# Patient Record
Sex: Female | Born: 1955 | Race: White | Hispanic: No | Smoking: Current every day smoker
Health system: Southern US, Community
[De-identification: ages and names within clinical notes are randomized; demographics above are authoritative.]

## PROBLEM LIST (undated history)

## (undated) DIAGNOSIS — F32A Depression, unspecified: Secondary | ICD-10-CM

## (undated) DIAGNOSIS — F329 Major depressive disorder, single episode, unspecified: Secondary | ICD-10-CM

## (undated) DIAGNOSIS — J449 Chronic obstructive pulmonary disease, unspecified: Secondary | ICD-10-CM

## (undated) DIAGNOSIS — F319 Bipolar disorder, unspecified: Secondary | ICD-10-CM

## (undated) DIAGNOSIS — K219 Gastro-esophageal reflux disease without esophagitis: Secondary | ICD-10-CM

## (undated) HISTORY — DX: Gastro-esophageal reflux disease without esophagitis: K21.9

## (undated) HISTORY — DX: Bipolar disorder, unspecified: F31.9

## (undated) HISTORY — DX: Depression, unspecified: F32.A

## (undated) HISTORY — PX: CHOLECYSTECTOMY: SHX55

## (undated) HISTORY — DX: Chronic obstructive pulmonary disease, unspecified: J44.9

## (undated) HISTORY — PX: LUMBAR LAMINECTOMY: SHX95

## (undated) HISTORY — PX: APPENDECTOMY: SHX54

## (undated) HISTORY — DX: Major depressive disorder, single episode, unspecified: F32.9

---

## 1997-10-30 ENCOUNTER — Emergency Department (HOSPITAL_COMMUNITY): Admission: EM | Admit: 1997-10-30 | Discharge: 1997-10-30 | Payer: Self-pay | Admitting: Emergency Medicine

## 1998-03-07 ENCOUNTER — Emergency Department (HOSPITAL_COMMUNITY): Admission: EM | Admit: 1998-03-07 | Discharge: 1998-03-07 | Payer: Self-pay | Admitting: Emergency Medicine

## 1998-04-20 ENCOUNTER — Emergency Department (HOSPITAL_COMMUNITY): Admission: EM | Admit: 1998-04-20 | Discharge: 1998-04-20 | Payer: Self-pay | Admitting: Internal Medicine

## 1998-05-02 ENCOUNTER — Ambulatory Visit (HOSPITAL_COMMUNITY): Admission: RE | Admit: 1998-05-02 | Discharge: 1998-05-02 | Payer: Self-pay | Admitting: Psychiatry

## 1998-05-29 ENCOUNTER — Ambulatory Visit (HOSPITAL_BASED_OUTPATIENT_CLINIC_OR_DEPARTMENT_OTHER): Admission: RE | Admit: 1998-05-29 | Discharge: 1998-05-29 | Payer: Self-pay | Admitting: *Deleted

## 1998-06-20 ENCOUNTER — Ambulatory Visit (HOSPITAL_BASED_OUTPATIENT_CLINIC_OR_DEPARTMENT_OTHER): Admission: RE | Admit: 1998-06-20 | Discharge: 1998-06-20 | Payer: Self-pay | Admitting: *Deleted

## 1998-07-09 ENCOUNTER — Encounter: Payer: Self-pay | Admitting: Family Medicine

## 1998-07-09 ENCOUNTER — Ambulatory Visit (HOSPITAL_COMMUNITY): Admission: RE | Admit: 1998-07-09 | Discharge: 1998-07-09 | Payer: Self-pay | Admitting: Family Medicine

## 1998-08-22 ENCOUNTER — Emergency Department (HOSPITAL_COMMUNITY): Admission: EM | Admit: 1998-08-22 | Discharge: 1998-08-22 | Payer: Self-pay | Admitting: Emergency Medicine

## 1998-08-22 ENCOUNTER — Inpatient Hospital Stay (HOSPITAL_COMMUNITY): Admission: EM | Admit: 1998-08-22 | Discharge: 1998-08-28 | Payer: Self-pay | Admitting: Emergency Medicine

## 1998-08-30 ENCOUNTER — Other Ambulatory Visit: Admission: RE | Admit: 1998-08-30 | Discharge: 1998-08-30 | Payer: Self-pay | Admitting: Gynecology

## 1998-09-02 ENCOUNTER — Emergency Department (HOSPITAL_COMMUNITY): Admission: EM | Admit: 1998-09-02 | Discharge: 1998-09-02 | Payer: Self-pay | Admitting: *Deleted

## 1998-09-09 ENCOUNTER — Encounter: Payer: Self-pay | Admitting: Emergency Medicine

## 1998-09-09 ENCOUNTER — Emergency Department (HOSPITAL_COMMUNITY): Admission: EM | Admit: 1998-09-09 | Discharge: 1998-09-09 | Payer: Self-pay | Admitting: Emergency Medicine

## 1998-09-11 ENCOUNTER — Other Ambulatory Visit: Admission: RE | Admit: 1998-09-11 | Discharge: 1998-09-11 | Payer: Self-pay | Admitting: Gynecology

## 1998-09-23 ENCOUNTER — Emergency Department (HOSPITAL_COMMUNITY): Admission: EM | Admit: 1998-09-23 | Discharge: 1998-09-23 | Payer: Self-pay | Admitting: Emergency Medicine

## 1998-10-30 ENCOUNTER — Inpatient Hospital Stay (HOSPITAL_COMMUNITY): Admission: EM | Admit: 1998-10-30 | Discharge: 1998-10-31 | Payer: Self-pay | Admitting: Emergency Medicine

## 1998-11-04 ENCOUNTER — Emergency Department (HOSPITAL_COMMUNITY): Admission: EM | Admit: 1998-11-04 | Discharge: 1998-11-04 | Payer: Self-pay | Admitting: Emergency Medicine

## 1998-11-08 ENCOUNTER — Emergency Department (HOSPITAL_COMMUNITY): Admission: EM | Admit: 1998-11-08 | Discharge: 1998-11-08 | Payer: Self-pay | Admitting: Emergency Medicine

## 1998-11-24 ENCOUNTER — Emergency Department (HOSPITAL_COMMUNITY): Admission: EM | Admit: 1998-11-24 | Discharge: 1998-11-24 | Payer: Self-pay | Admitting: Emergency Medicine

## 1998-12-02 ENCOUNTER — Emergency Department (HOSPITAL_COMMUNITY): Admission: EM | Admit: 1998-12-02 | Discharge: 1998-12-02 | Payer: Self-pay | Admitting: Emergency Medicine

## 1998-12-02 ENCOUNTER — Encounter: Payer: Self-pay | Admitting: *Deleted

## 1999-01-09 ENCOUNTER — Emergency Department (HOSPITAL_COMMUNITY): Admission: EM | Admit: 1999-01-09 | Discharge: 1999-01-09 | Payer: Self-pay | Admitting: Emergency Medicine

## 1999-01-10 ENCOUNTER — Emergency Department (HOSPITAL_COMMUNITY): Admission: EM | Admit: 1999-01-10 | Discharge: 1999-01-10 | Payer: Self-pay | Admitting: Emergency Medicine

## 1999-03-11 ENCOUNTER — Emergency Department (HOSPITAL_COMMUNITY): Admission: EM | Admit: 1999-03-11 | Discharge: 1999-03-11 | Payer: Self-pay | Admitting: Emergency Medicine

## 1999-04-16 ENCOUNTER — Emergency Department (HOSPITAL_COMMUNITY): Admission: EM | Admit: 1999-04-16 | Discharge: 1999-04-16 | Payer: Self-pay | Admitting: Emergency Medicine

## 1999-04-17 ENCOUNTER — Inpatient Hospital Stay (HOSPITAL_COMMUNITY): Admission: EM | Admit: 1999-04-17 | Discharge: 1999-04-22 | Payer: Self-pay | Admitting: Psychiatry

## 1999-05-07 ENCOUNTER — Encounter: Payer: Self-pay | Admitting: Neurosurgery

## 1999-05-07 ENCOUNTER — Encounter: Admission: RE | Admit: 1999-05-07 | Discharge: 1999-05-07 | Payer: Self-pay | Admitting: Neurosurgery

## 1999-05-23 ENCOUNTER — Encounter: Admission: RE | Admit: 1999-05-23 | Discharge: 1999-05-23 | Payer: Self-pay | Admitting: Gynecology

## 1999-05-23 ENCOUNTER — Encounter: Payer: Self-pay | Admitting: Gynecology

## 1999-08-03 ENCOUNTER — Encounter: Payer: Self-pay | Admitting: Neurosurgery

## 1999-08-03 ENCOUNTER — Encounter: Admission: RE | Admit: 1999-08-03 | Discharge: 1999-08-03 | Payer: Self-pay | Admitting: Neurosurgery

## 2000-04-06 ENCOUNTER — Encounter: Admission: RE | Admit: 2000-04-06 | Discharge: 2000-04-06 | Payer: Self-pay | Admitting: Family Medicine

## 2000-04-06 ENCOUNTER — Encounter: Payer: Self-pay | Admitting: Family Medicine

## 2000-04-23 ENCOUNTER — Other Ambulatory Visit: Admission: RE | Admit: 2000-04-23 | Discharge: 2000-04-23 | Payer: Self-pay | Admitting: Gynecology

## 2001-06-21 ENCOUNTER — Encounter: Payer: Self-pay | Admitting: Family Medicine

## 2001-06-21 ENCOUNTER — Ambulatory Visit (HOSPITAL_COMMUNITY): Admission: RE | Admit: 2001-06-21 | Discharge: 2001-06-21 | Payer: Self-pay | Admitting: Family Medicine

## 2001-07-23 ENCOUNTER — Encounter: Admission: RE | Admit: 2001-07-23 | Discharge: 2001-07-23 | Payer: Self-pay | Admitting: Gynecology

## 2001-07-23 ENCOUNTER — Encounter: Payer: Self-pay | Admitting: Gynecology

## 2002-01-25 ENCOUNTER — Ambulatory Visit (HOSPITAL_BASED_OUTPATIENT_CLINIC_OR_DEPARTMENT_OTHER): Admission: RE | Admit: 2002-01-25 | Discharge: 2002-01-25 | Payer: Self-pay | Admitting: Family Medicine

## 2002-03-16 ENCOUNTER — Encounter: Payer: Self-pay | Admitting: Family Medicine

## 2002-03-16 ENCOUNTER — Encounter: Admission: RE | Admit: 2002-03-16 | Discharge: 2002-03-16 | Payer: Self-pay | Admitting: Family Medicine

## 2002-04-17 ENCOUNTER — Encounter: Admission: RE | Admit: 2002-04-17 | Discharge: 2002-04-17 | Payer: Self-pay | Admitting: Neurosurgery

## 2002-04-17 ENCOUNTER — Encounter: Payer: Self-pay | Admitting: Neurosurgery

## 2002-10-17 ENCOUNTER — Encounter: Payer: Self-pay | Admitting: Pain Medicine

## 2002-10-17 ENCOUNTER — Encounter: Admission: RE | Admit: 2002-10-17 | Discharge: 2002-10-17 | Payer: Self-pay | Admitting: Pain Medicine

## 2002-10-26 ENCOUNTER — Encounter: Payer: Self-pay | Admitting: Family Medicine

## 2002-10-26 ENCOUNTER — Ambulatory Visit (HOSPITAL_COMMUNITY): Admission: RE | Admit: 2002-10-26 | Discharge: 2002-10-26 | Payer: Self-pay | Admitting: Family Medicine

## 2003-01-13 ENCOUNTER — Ambulatory Visit (HOSPITAL_COMMUNITY): Admission: RE | Admit: 2003-01-13 | Discharge: 2003-01-13 | Payer: Self-pay | Admitting: Family Medicine

## 2003-01-13 ENCOUNTER — Encounter: Payer: Self-pay | Admitting: Family Medicine

## 2003-03-08 ENCOUNTER — Other Ambulatory Visit: Admission: RE | Admit: 2003-03-08 | Discharge: 2003-03-08 | Payer: Self-pay | Admitting: Gynecology

## 2003-03-23 ENCOUNTER — Encounter: Admission: RE | Admit: 2003-03-23 | Discharge: 2003-03-23 | Payer: Self-pay | Admitting: Gynecology

## 2003-03-23 ENCOUNTER — Encounter: Payer: Self-pay | Admitting: Gynecology

## 2003-04-11 ENCOUNTER — Ambulatory Visit (HOSPITAL_BASED_OUTPATIENT_CLINIC_OR_DEPARTMENT_OTHER): Admission: RE | Admit: 2003-04-11 | Discharge: 2003-04-11 | Payer: Self-pay | Admitting: Oral Surgery

## 2004-04-24 ENCOUNTER — Ambulatory Visit: Payer: Self-pay | Admitting: Psychiatry

## 2004-04-25 ENCOUNTER — Inpatient Hospital Stay (HOSPITAL_COMMUNITY): Admission: EM | Admit: 2004-04-25 | Discharge: 2004-04-27 | Payer: Self-pay | Admitting: Emergency Medicine

## 2004-04-27 ENCOUNTER — Inpatient Hospital Stay (HOSPITAL_COMMUNITY): Admission: AD | Admit: 2004-04-27 | Discharge: 2004-05-03 | Payer: Self-pay | Admitting: Psychiatry

## 2004-04-27 ENCOUNTER — Ambulatory Visit: Payer: Self-pay | Admitting: Psychiatry

## 2004-08-14 ENCOUNTER — Ambulatory Visit: Payer: Self-pay | Admitting: Psychiatry

## 2004-08-14 ENCOUNTER — Inpatient Hospital Stay (HOSPITAL_COMMUNITY): Admission: EM | Admit: 2004-08-14 | Discharge: 2004-08-19 | Payer: Self-pay | Admitting: Psychiatry

## 2004-08-20 ENCOUNTER — Emergency Department (HOSPITAL_COMMUNITY): Admission: EM | Admit: 2004-08-20 | Discharge: 2004-08-21 | Payer: Self-pay | Admitting: Emergency Medicine

## 2004-08-23 ENCOUNTER — Emergency Department (HOSPITAL_COMMUNITY): Admission: EM | Admit: 2004-08-23 | Discharge: 2004-08-23 | Payer: Self-pay | Admitting: Emergency Medicine

## 2004-08-29 ENCOUNTER — Ambulatory Visit: Payer: Self-pay | Admitting: Psychiatry

## 2004-08-29 ENCOUNTER — Inpatient Hospital Stay (HOSPITAL_COMMUNITY): Admission: RE | Admit: 2004-08-29 | Discharge: 2004-08-31 | Payer: Self-pay | Admitting: Psychiatry

## 2004-10-31 ENCOUNTER — Inpatient Hospital Stay (HOSPITAL_COMMUNITY): Admission: EM | Admit: 2004-10-31 | Discharge: 2004-11-02 | Payer: Self-pay | Admitting: Emergency Medicine

## 2004-11-02 ENCOUNTER — Ambulatory Visit: Payer: Self-pay | Admitting: Psychiatry

## 2004-11-03 ENCOUNTER — Emergency Department (HOSPITAL_COMMUNITY): Admission: EM | Admit: 2004-11-03 | Discharge: 2004-11-03 | Payer: Self-pay | Admitting: Emergency Medicine

## 2004-12-17 ENCOUNTER — Ambulatory Visit (HOSPITAL_COMMUNITY): Admission: RE | Admit: 2004-12-17 | Discharge: 2004-12-17 | Payer: Self-pay | Admitting: Family Medicine

## 2004-12-18 ENCOUNTER — Emergency Department (HOSPITAL_COMMUNITY): Admission: EM | Admit: 2004-12-18 | Discharge: 2004-12-18 | Payer: Self-pay | Admitting: Emergency Medicine

## 2004-12-24 ENCOUNTER — Emergency Department (HOSPITAL_COMMUNITY): Admission: EM | Admit: 2004-12-24 | Discharge: 2004-12-24 | Payer: Self-pay | Admitting: Emergency Medicine

## 2005-01-17 ENCOUNTER — Emergency Department (HOSPITAL_COMMUNITY): Admission: EM | Admit: 2005-01-17 | Discharge: 2005-01-17 | Payer: Self-pay | Admitting: Emergency Medicine

## 2005-02-26 ENCOUNTER — Emergency Department (HOSPITAL_COMMUNITY): Admission: EM | Admit: 2005-02-26 | Discharge: 2005-02-26 | Payer: Self-pay | Admitting: Emergency Medicine

## 2005-04-07 ENCOUNTER — Other Ambulatory Visit: Admission: RE | Admit: 2005-04-07 | Discharge: 2005-04-07 | Payer: Self-pay | Admitting: Gynecology

## 2005-07-04 ENCOUNTER — Emergency Department (HOSPITAL_COMMUNITY): Admission: EM | Admit: 2005-07-04 | Discharge: 2005-07-05 | Payer: Self-pay | Admitting: Emergency Medicine

## 2005-08-12 ENCOUNTER — Encounter: Admission: RE | Admit: 2005-08-12 | Discharge: 2005-08-12 | Payer: Self-pay

## 2006-01-02 ENCOUNTER — Encounter: Admission: RE | Admit: 2006-01-02 | Discharge: 2006-01-02 | Payer: Self-pay

## 2006-03-06 ENCOUNTER — Ambulatory Visit: Payer: Self-pay | Admitting: Internal Medicine

## 2006-03-18 ENCOUNTER — Ambulatory Visit: Payer: Self-pay | Admitting: *Deleted

## 2006-04-15 ENCOUNTER — Ambulatory Visit: Payer: Self-pay | Admitting: Gastroenterology

## 2006-05-05 ENCOUNTER — Encounter: Admission: RE | Admit: 2006-05-05 | Discharge: 2006-05-05 | Payer: Self-pay

## 2006-08-15 IMAGING — CR DG LUMBAR SPINE 2-3V
2 series · 2 of 2 positions shown · non-contrast
Comparison: 12/17/04.

CLINICAL DATA: Status post lumbar fusion.  Pain pump placement in [DATE] with reportedly no relief from pain. 
 FOUR VIEW LUMBAR SPINE:

[view not recorded (1 of 2)]
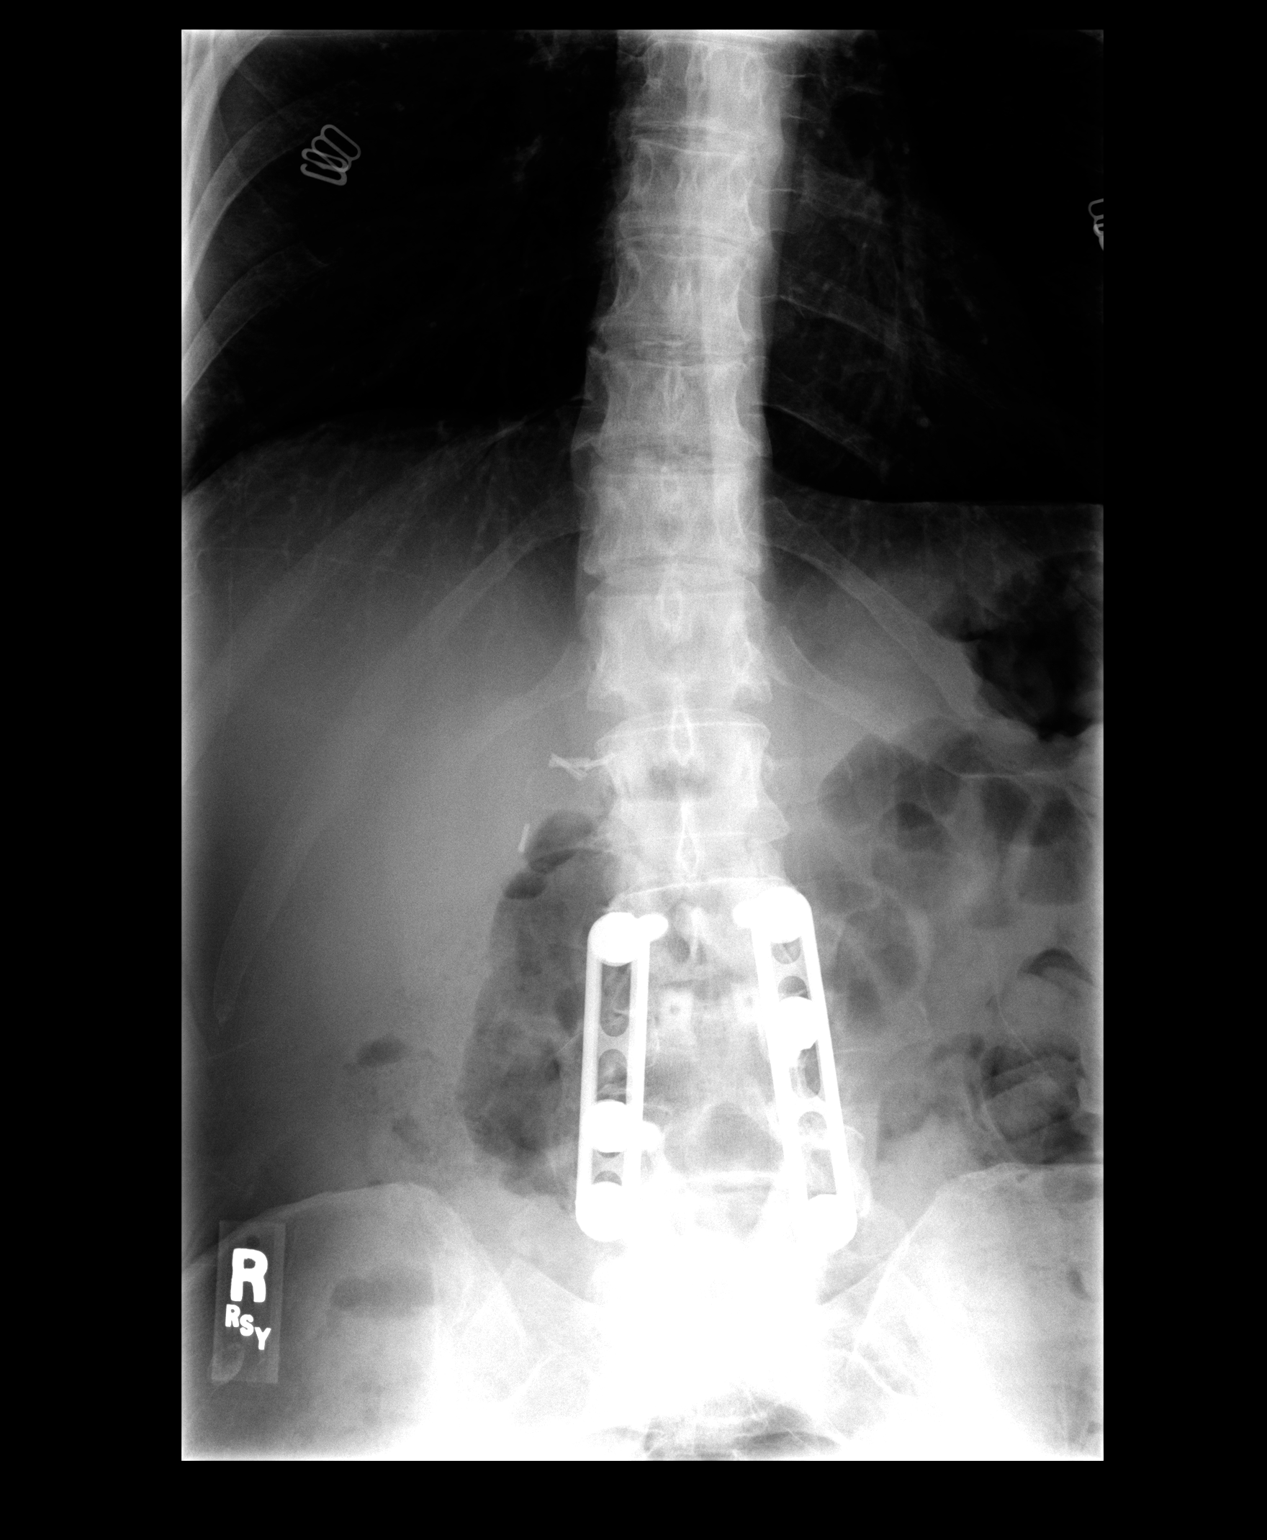

[view not recorded (2 of 2)]
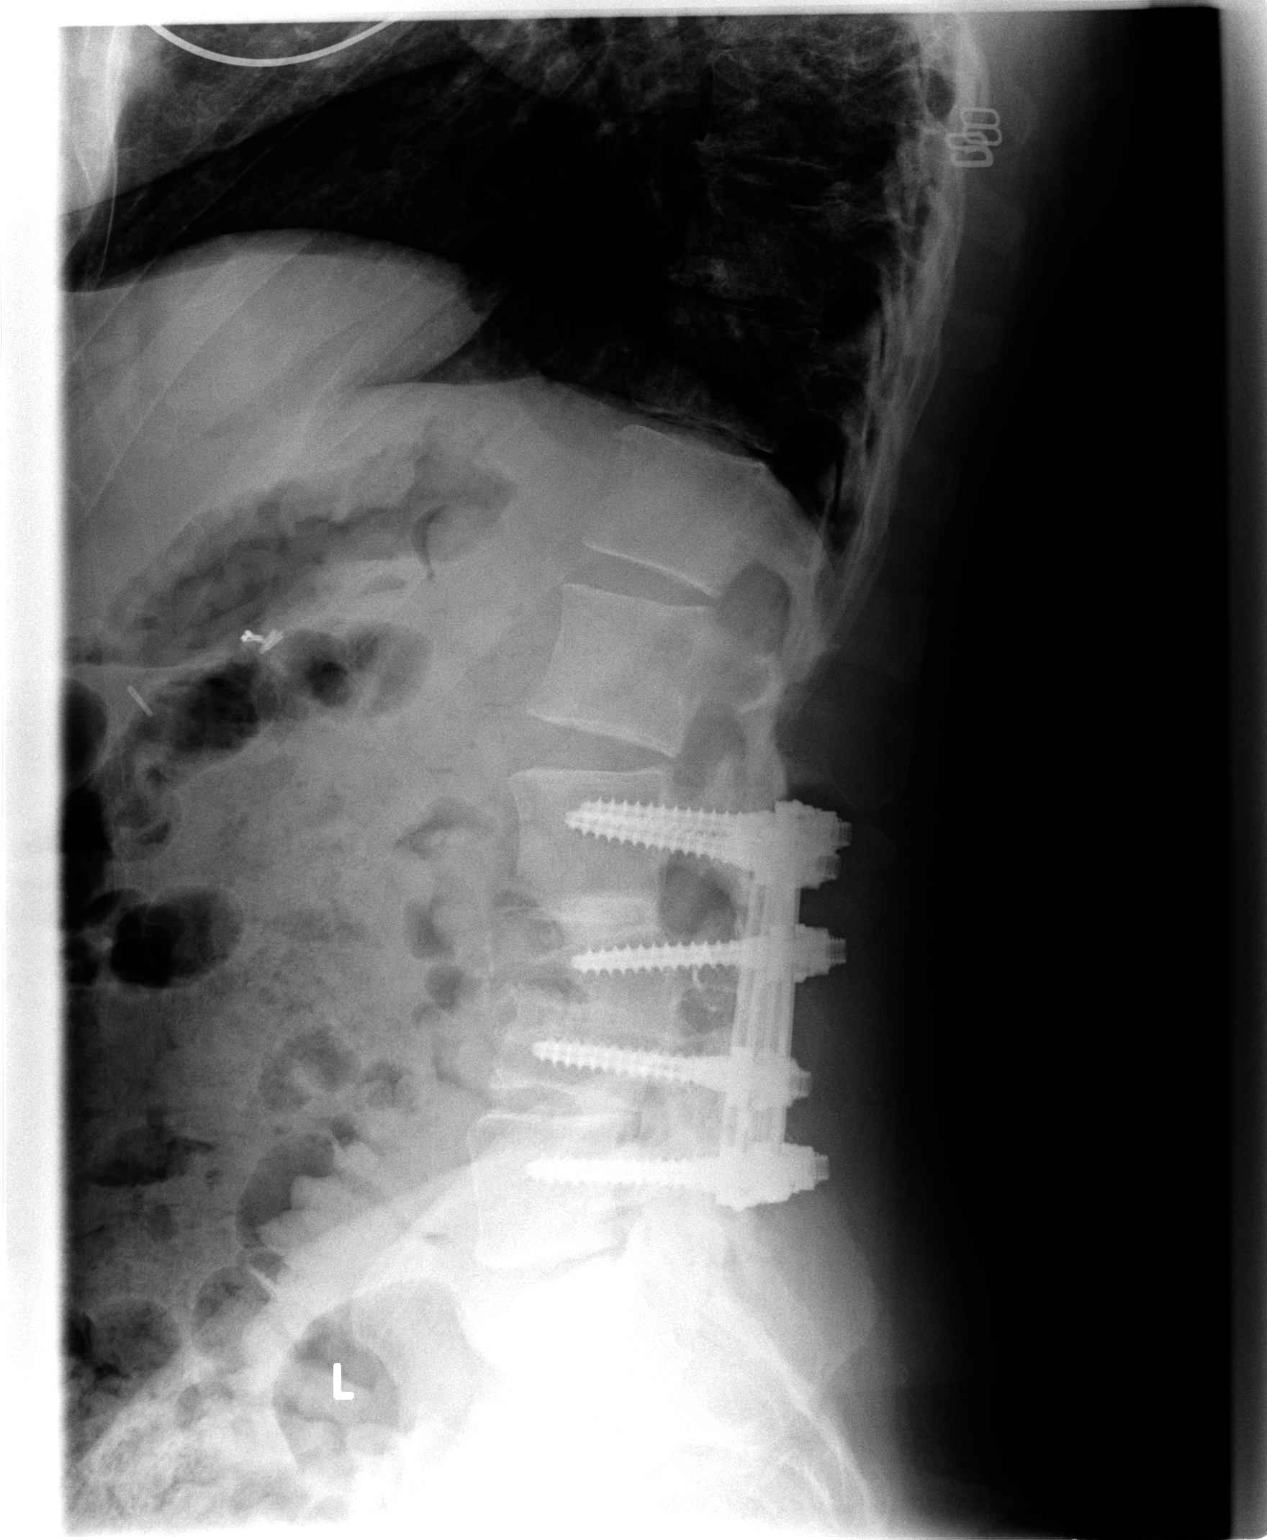

[2 of 2 positions shown; findings below may reference images not displayed]

Patient is again noted to be status post fusion from L2 to L5 with bilateral pedicle screws at the L2 and L5 levels, single left screw at L3 and a single right pedicle screw at L4.  There is compression deformity at the L3 and L4 vertebral bodies.  Since the 12/17/04 exam, there has been no change in bony alignment nor position of the spinal hardware.  No evidence for hardware complications.  
 The patient has a pain pump in place but there is no evidence for a catheter tracking into the epidural space of the lower spinal canal.
IMPRESSION: 1.  Stable bony alignment and position of the spinal hardware. 
 2.  No evidence for hardware complications.

## 2006-08-15 IMAGING — CR DG ABDOMEN 1V
1 series · 1 of 1 positions shown · non-contrast
Comparison: Lumbar spine study from 12/17/04.

CLINICAL DATA: Low back pain.  History of subcutaneous pump placement. 
 ABDOMEN ONE VIEW:

[view not recorded]
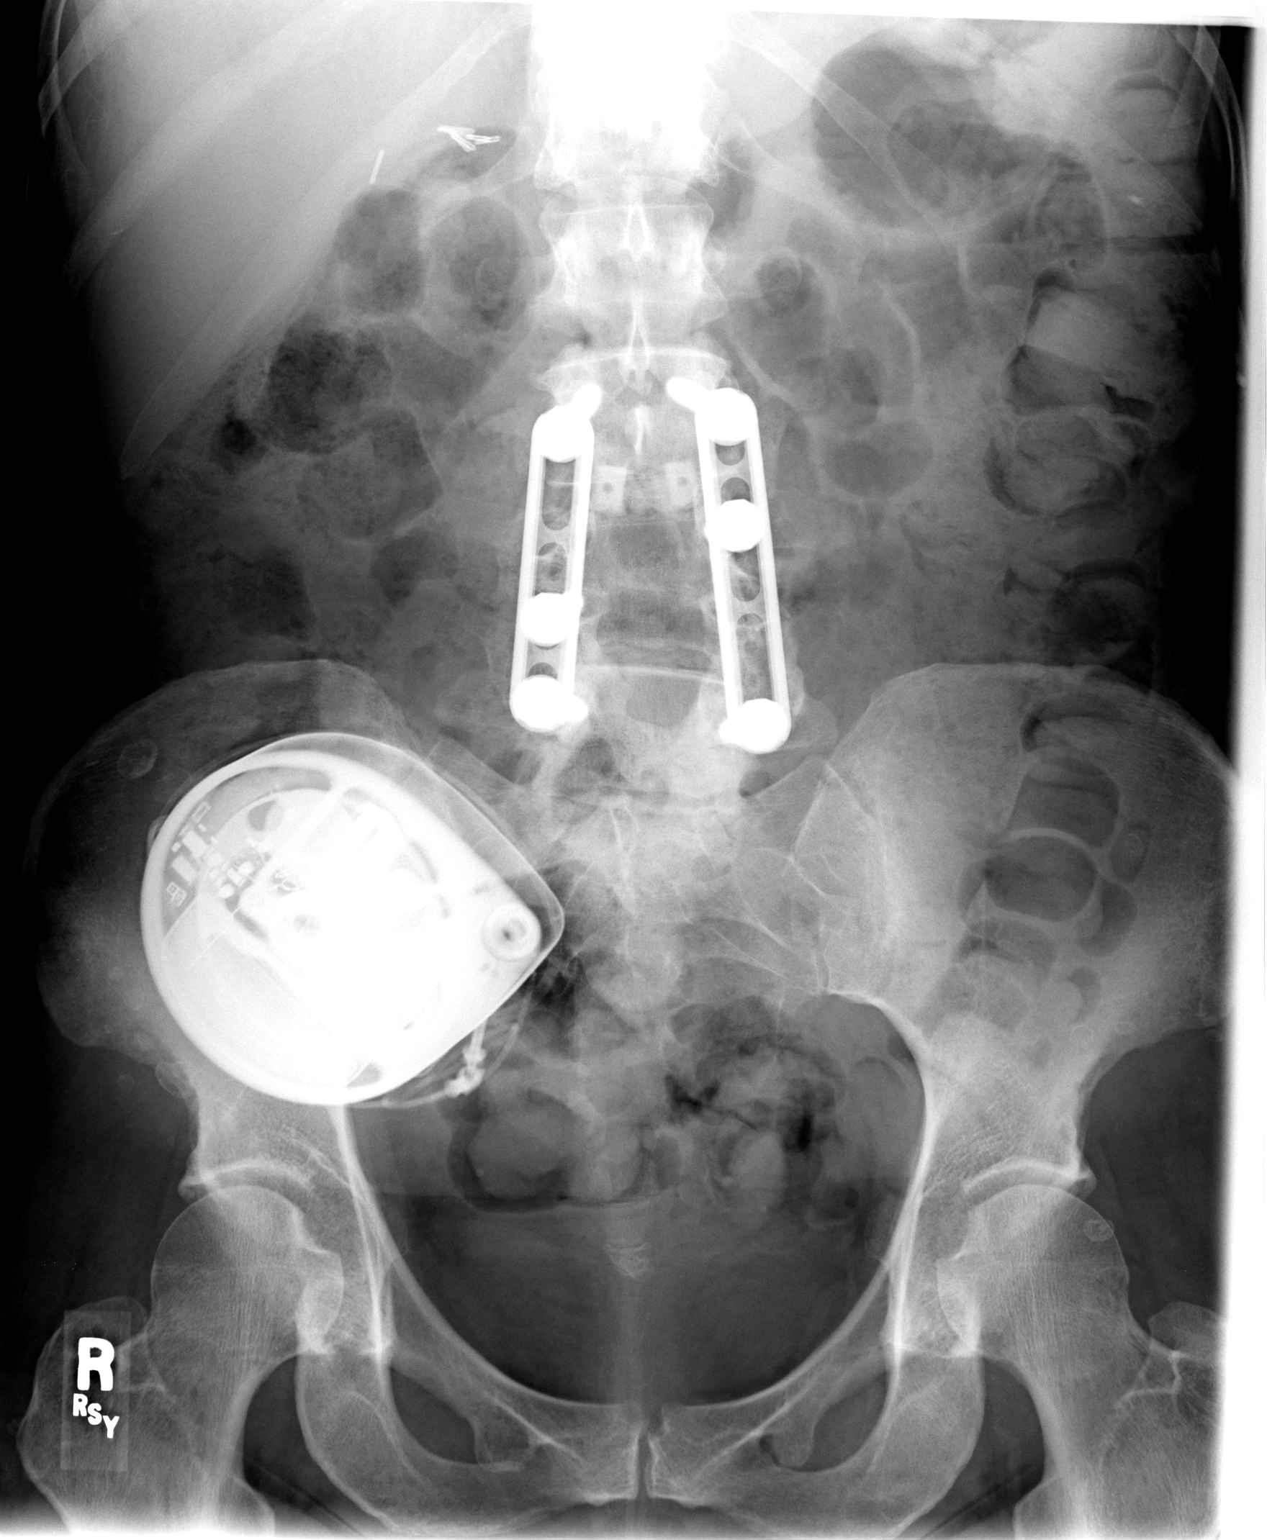

[1 of 1 positions shown; findings below may reference images not displayed]

The patient is fused from L2 to L5, as before.  No appreciable interval change in bony alignment or position of the spinal hardware.  
 Pump device projects over the right SI joint/iliac crest.  Catheter appears to be coiled and superimposed on the pump with no catheter seen directed into the spinal canal cranial to the lumbosacral junction.
IMPRESSION: Pain pump projects over the right anatomic pelvis although the catheter is not visualized.

## 2006-08-18 ENCOUNTER — Ambulatory Visit: Payer: Self-pay | Admitting: Internal Medicine

## 2006-08-21 ENCOUNTER — Ambulatory Visit: Payer: Self-pay | Admitting: Internal Medicine

## 2006-08-21 LAB — CONVERTED CEMR LAB
Basophils Absolute: 0.1 10*3/uL (ref 0.0–0.1)
Basophils Relative: 2 % — ABNORMAL HIGH (ref 0.0–1.0)
Eosinophils Absolute: 0.2 10*3/uL (ref 0.0–0.6)
Eosinophils Relative: 4.5 % (ref 0.0–5.0)
HCT: 38.3 % (ref 36.0–46.0)
Hemoglobin: 13.4 g/dL (ref 12.0–15.0)
Lymphocytes Relative: 36.6 % (ref 12.0–46.0)
MCHC: 35 g/dL (ref 30.0–36.0)
MCV: 85.2 fL (ref 78.0–100.0)
Monocytes Absolute: 0.5 10*3/uL (ref 0.2–0.7)
Monocytes Relative: 14.7 % — ABNORMAL HIGH (ref 3.0–11.0)
Neutro Abs: 1.4 10*3/uL (ref 1.4–7.7)
Neutrophils Relative %: 42.2 % — ABNORMAL LOW (ref 43.0–77.0)
Platelets: 290 10*3/uL (ref 150–400)
RBC: 4.5 M/uL (ref 3.87–5.11)
RDW: 14.1 % (ref 11.5–14.6)
WBC: 3.5 10*3/uL — ABNORMAL LOW (ref 4.5–10.5)

## 2006-09-07 ENCOUNTER — Ambulatory Visit: Payer: Self-pay | Admitting: Gastroenterology

## 2006-09-21 ENCOUNTER — Emergency Department (HOSPITAL_COMMUNITY): Admission: EM | Admit: 2006-09-21 | Discharge: 2006-09-21 | Payer: Self-pay | Admitting: Emergency Medicine

## 2006-10-29 ENCOUNTER — Emergency Department (HOSPITAL_COMMUNITY): Admission: EM | Admit: 2006-10-29 | Discharge: 2006-10-29 | Payer: Self-pay | Admitting: Emergency Medicine

## 2006-11-13 ENCOUNTER — Ambulatory Visit: Payer: Self-pay | Admitting: Internal Medicine

## 2006-11-16 ENCOUNTER — Encounter: Admission: RE | Admit: 2006-11-16 | Discharge: 2006-11-16 | Payer: Self-pay | Admitting: Neurological Surgery

## 2006-11-18 ENCOUNTER — Ambulatory Visit: Payer: Self-pay | Admitting: Internal Medicine

## 2006-11-23 ENCOUNTER — Encounter: Admission: RE | Admit: 2006-11-23 | Discharge: 2006-11-23 | Payer: Self-pay | Admitting: Internal Medicine

## 2006-11-30 ENCOUNTER — Encounter: Admission: RE | Admit: 2006-11-30 | Discharge: 2006-11-30 | Payer: Self-pay | Admitting: Internal Medicine

## 2006-12-04 ENCOUNTER — Ambulatory Visit: Payer: Self-pay | Admitting: Internal Medicine

## 2006-12-15 ENCOUNTER — Ambulatory Visit: Payer: Self-pay | Admitting: Internal Medicine

## 2007-01-05 ENCOUNTER — Ambulatory Visit: Payer: Self-pay | Admitting: Internal Medicine

## 2007-01-06 LAB — CONVERTED CEMR LAB
Basophils Absolute: 0 10*3/uL (ref 0.0–0.1)
Basophils Relative: 0.5 % (ref 0.0–1.0)
Eosinophils Absolute: 0.2 10*3/uL (ref 0.0–0.6)
Eosinophils Relative: 4 % (ref 0.0–5.0)
HCT: 39.8 % (ref 36.0–46.0)
Hemoglobin: 13.2 g/dL (ref 12.0–15.0)
Lymphocytes Relative: 37 % (ref 12.0–46.0)
MCHC: 33.1 g/dL (ref 30.0–36.0)
MCV: 86.2 fL (ref 78.0–100.0)
Monocytes Absolute: 0.3 10*3/uL (ref 0.2–0.7)
Monocytes Relative: 6.7 % (ref 3.0–11.0)
Neutro Abs: 2.8 10*3/uL (ref 1.4–7.7)
Neutrophils Relative %: 51.8 % (ref 43.0–77.0)
Platelets: 267 10*3/uL (ref 150–400)
RBC: 4.62 M/uL (ref 3.87–5.11)
RDW: 13.8 % (ref 11.5–14.6)
TSH: 5.6 microintl units/mL — ABNORMAL HIGH (ref 0.35–5.50)
WBC: 5.2 10*3/uL (ref 4.5–10.5)

## 2007-01-14 ENCOUNTER — Ambulatory Visit: Payer: Self-pay | Admitting: Internal Medicine

## 2007-01-14 ENCOUNTER — Telehealth: Payer: Self-pay | Admitting: Internal Medicine

## 2007-02-16 ENCOUNTER — Emergency Department (HOSPITAL_COMMUNITY): Admission: EM | Admit: 2007-02-16 | Discharge: 2007-02-16 | Payer: Self-pay | Admitting: Emergency Medicine

## 2007-03-29 ENCOUNTER — Telehealth: Payer: Self-pay | Admitting: Internal Medicine

## 2007-11-15 ENCOUNTER — Ambulatory Visit: Payer: Self-pay | Admitting: Internal Medicine

## 2007-11-15 ENCOUNTER — Encounter: Payer: Self-pay | Admitting: Internal Medicine

## 2007-11-15 DIAGNOSIS — J449 Chronic obstructive pulmonary disease, unspecified: Secondary | ICD-10-CM | POA: Insufficient documentation

## 2007-11-15 DIAGNOSIS — F3181 Bipolar II disorder: Secondary | ICD-10-CM | POA: Insufficient documentation

## 2007-11-16 LAB — CONVERTED CEMR LAB
ALT: 20 units/L (ref 0–35)
AST: 23 units/L (ref 0–37)
Albumin: 3.6 g/dL (ref 3.5–5.2)
Alkaline Phosphatase: 95 units/L (ref 39–117)
BUN: 8 mg/dL (ref 6–23)
Basophils Absolute: 0.1 10*3/uL (ref 0.0–0.1)
Basophils Relative: 2.8 % — ABNORMAL HIGH (ref 0.0–1.0)
Bilirubin, Direct: 0.1 mg/dL (ref 0.0–0.3)
CO2: 29 meq/L (ref 19–32)
Calcium: 9.7 mg/dL (ref 8.4–10.5)
Chloride: 103 meq/L (ref 96–112)
Creatinine, Ser: 0.7 mg/dL (ref 0.4–1.2)
Eosinophils Absolute: 0.2 10*3/uL (ref 0.0–0.7)
Eosinophils Relative: 3.1 % (ref 0.0–5.0)
GFR calc Af Amer: 113 mL/min
GFR calc non Af Amer: 93 mL/min
Glucose, Bld: 124 mg/dL — ABNORMAL HIGH (ref 70–99)
HCT: 42.5 % (ref 36.0–46.0)
Hemoglobin: 14.1 g/dL (ref 12.0–15.0)
Lymphocytes Relative: 35.9 % (ref 12.0–46.0)
MCHC: 33.1 g/dL (ref 30.0–36.0)
MCV: 85.7 fL (ref 78.0–100.0)
Monocytes Absolute: 0.1 10*3/uL (ref 0.1–1.0)
Monocytes Relative: 1.7 % — ABNORMAL LOW (ref 3.0–12.0)
Neutro Abs: 2.9 10*3/uL (ref 1.4–7.7)
Neutrophils Relative %: 56.5 % (ref 43.0–77.0)
Platelets: 308 10*3/uL (ref 150–400)
Potassium: 4 meq/L (ref 3.5–5.1)
RBC: 4.96 M/uL (ref 3.87–5.11)
RDW: 13.4 % (ref 11.5–14.6)
Sodium: 141 meq/L (ref 135–145)
TSH: 3.38 microintl units/mL (ref 0.35–5.50)
Total Bilirubin: 0.4 mg/dL (ref 0.3–1.2)
Total Protein: 7.2 g/dL (ref 6.0–8.3)
WBC: 5.1 10*3/uL (ref 4.5–10.5)

## 2007-11-21 DIAGNOSIS — F329 Major depressive disorder, single episode, unspecified: Secondary | ICD-10-CM

## 2007-11-21 DIAGNOSIS — F3289 Other specified depressive episodes: Secondary | ICD-10-CM | POA: Insufficient documentation

## 2007-11-23 ENCOUNTER — Telehealth: Payer: Self-pay | Admitting: Internal Medicine

## 2007-12-18 IMAGING — CR DG CHEST 2V
2 series · 2 of 2 positions shown · non-contrast
Comparison: 11/23/06.

CLINICAL DATA: Abnormal chest x-ray, cough. 
 CHEST - 2 VIEW:

[view not recorded (1 of 2)]
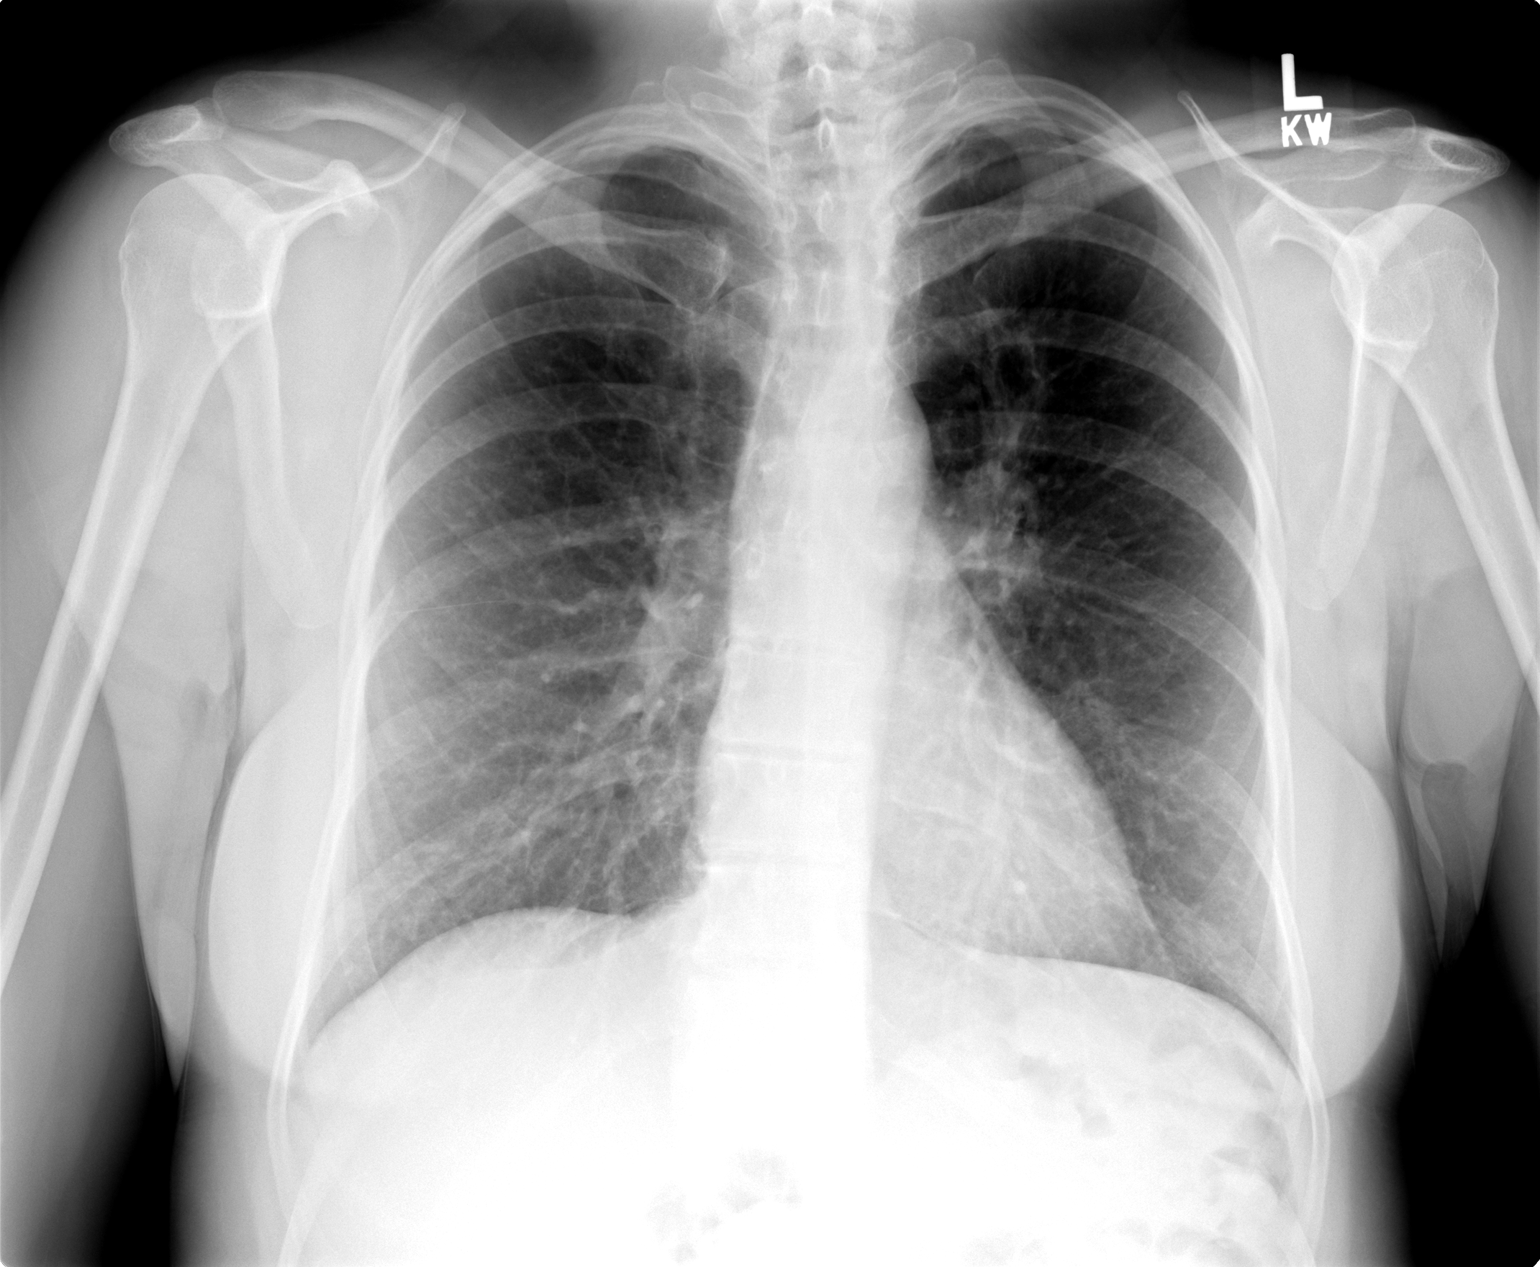

[view not recorded (2 of 2)]
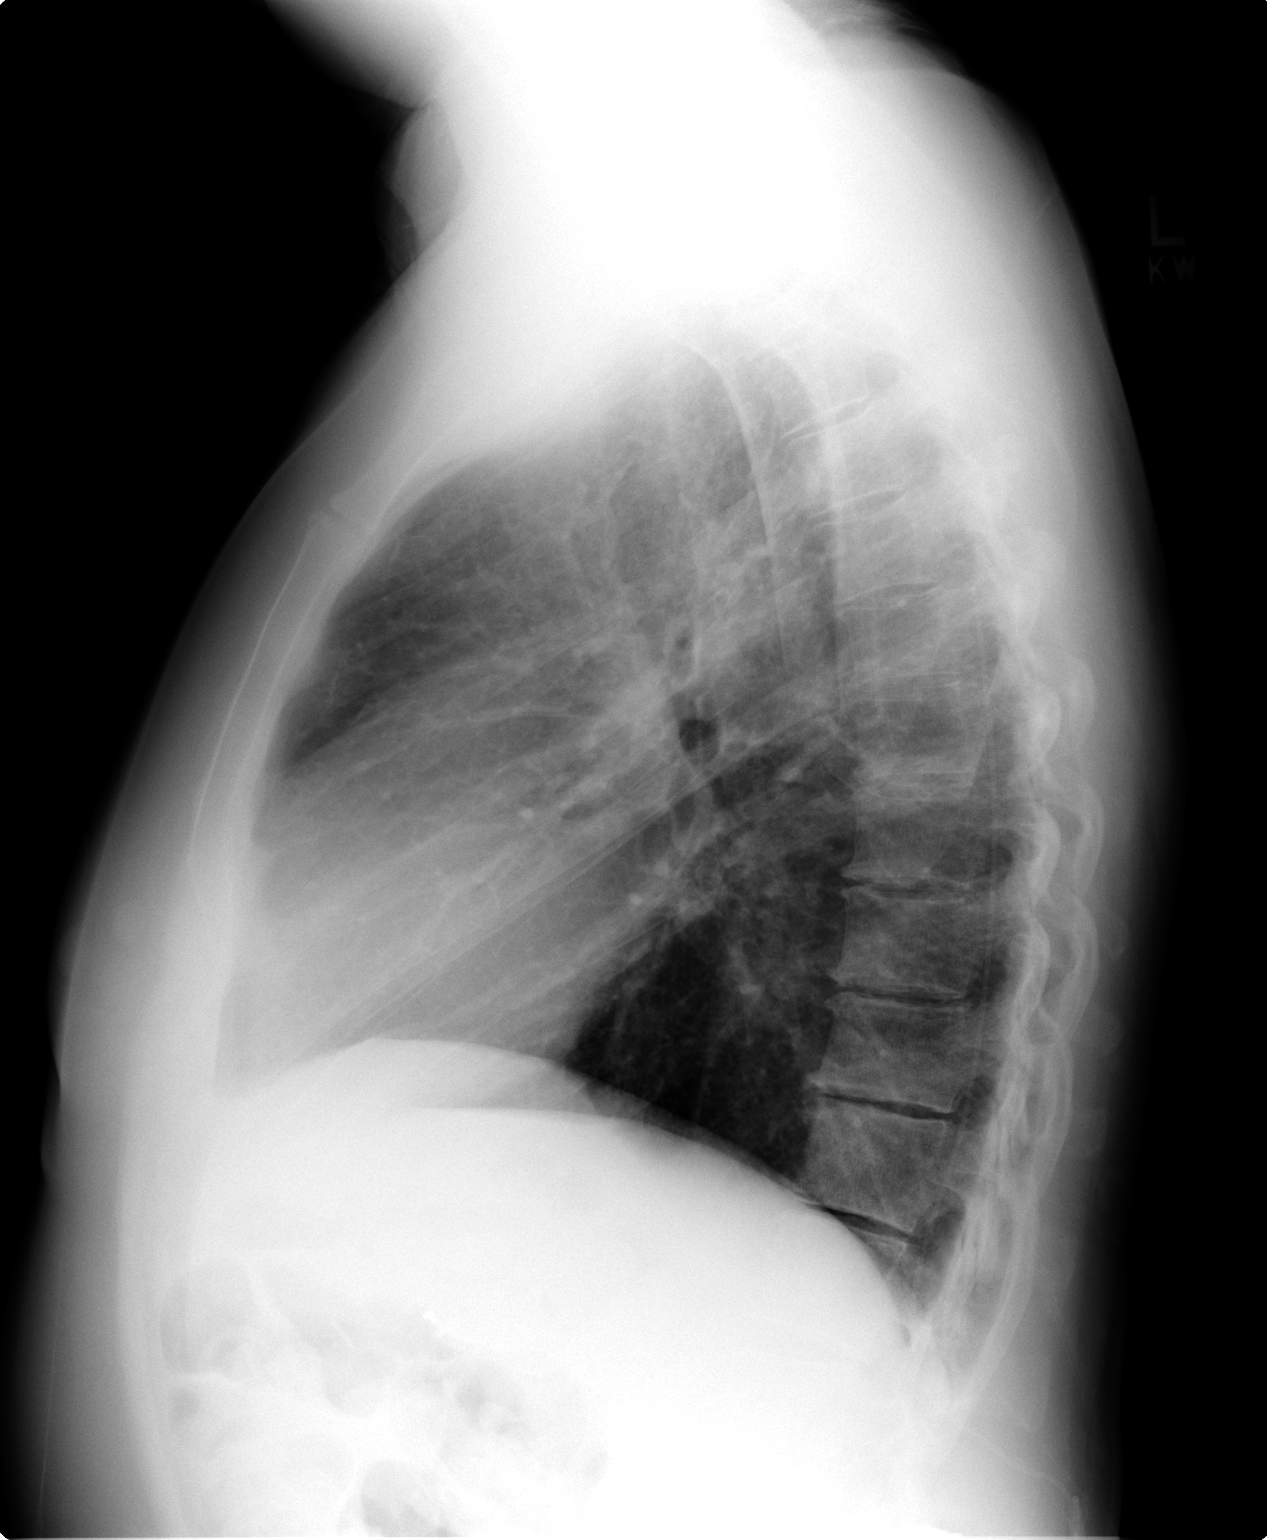

[2 of 2 positions shown; findings below may reference images not displayed]

FINDINGS: The left upper lobe density is less prominent but not completely resolved.   Recommend continued follow-up. 
 The heart and vascularity are normal.   The lungs are otherwise clear.
IMPRESSION: Left upper lobe density improved but not resolved.

## 2007-12-27 ENCOUNTER — Ambulatory Visit: Payer: Self-pay | Admitting: Internal Medicine

## 2008-01-12 ENCOUNTER — Ambulatory Visit: Payer: Self-pay | Admitting: Internal Medicine

## 2008-02-11 ENCOUNTER — Ambulatory Visit: Payer: Self-pay | Admitting: Internal Medicine

## 2008-02-11 DIAGNOSIS — K219 Gastro-esophageal reflux disease without esophagitis: Secondary | ICD-10-CM | POA: Insufficient documentation

## 2008-07-13 ENCOUNTER — Emergency Department (HOSPITAL_COMMUNITY): Admission: EM | Admit: 2008-07-13 | Discharge: 2008-07-13 | Payer: Self-pay | Admitting: Emergency Medicine

## 2008-11-18 ENCOUNTER — Emergency Department (HOSPITAL_BASED_OUTPATIENT_CLINIC_OR_DEPARTMENT_OTHER): Admission: EM | Admit: 2008-11-18 | Discharge: 2008-11-18 | Payer: Self-pay | Admitting: Emergency Medicine

## 2008-11-27 ENCOUNTER — Encounter: Payer: Self-pay | Admitting: Internal Medicine

## 2008-12-07 ENCOUNTER — Ambulatory Visit: Payer: Self-pay | Admitting: Family Medicine

## 2008-12-07 DIAGNOSIS — F17200 Nicotine dependence, unspecified, uncomplicated: Secondary | ICD-10-CM | POA: Insufficient documentation

## 2008-12-08 LAB — CONVERTED CEMR LAB
ALT: 20 units/L (ref 0–35)
AST: 23 units/L (ref 0–37)
Albumin: 3.5 g/dL (ref 3.5–5.2)
Alkaline Phosphatase: 79 units/L (ref 39–117)
BUN: 7 mg/dL (ref 6–23)
Basophils Absolute: 0.1 10*3/uL (ref 0.0–0.1)
Basophils Relative: 1.2 % (ref 0.0–3.0)
Bilirubin, Direct: 0 mg/dL (ref 0.0–0.3)
CO2: 28 meq/L (ref 19–32)
Calcium: 9.2 mg/dL (ref 8.4–10.5)
Chloride: 105 meq/L (ref 96–112)
Creatinine, Ser: 0.7 mg/dL (ref 0.4–1.2)
Eosinophils Absolute: 0.1 10*3/uL (ref 0.0–0.7)
Eosinophils Relative: 2.2 % (ref 0.0–5.0)
GFR calc non Af Amer: 92.41 mL/min (ref >60)
Glucose, Bld: 107 mg/dL — ABNORMAL HIGH (ref 70–99)
HCT: 45.5 % (ref 36.0–46.0)
Hemoglobin: 15.9 g/dL — ABNORMAL HIGH (ref 12.0–15.0)
Lymphocytes Relative: 26.9 % (ref 12.0–46.0)
Lymphs Abs: 1.6 10*3/uL (ref 0.7–4.0)
MCHC: 34.8 g/dL (ref 30.0–36.0)
MCV: 87.9 fL (ref 78.0–100.0)
Monocytes Absolute: 0.4 10*3/uL (ref 0.1–1.0)
Monocytes Relative: 6 % (ref 3.0–12.0)
Neutro Abs: 3.9 10*3/uL (ref 1.4–7.7)
Neutrophils Relative %: 63.7 % (ref 43.0–77.0)
Platelets: 246 10*3/uL (ref 150.0–400.0)
Potassium: 3.8 meq/L (ref 3.5–5.1)
RBC: 5.18 M/uL — ABNORMAL HIGH (ref 3.87–5.11)
RDW: 13.2 % (ref 11.5–14.6)
Sodium: 139 meq/L (ref 135–145)
TSH: 2.7 microintl units/mL (ref 0.35–5.50)
Total Bilirubin: 0.7 mg/dL (ref 0.3–1.2)
Total Protein: 6.6 g/dL (ref 6.0–8.3)
WBC: 6.1 10*3/uL (ref 4.5–10.5)

## 2008-12-12 ENCOUNTER — Telehealth (INDEPENDENT_AMBULATORY_CARE_PROVIDER_SITE_OTHER): Payer: Self-pay | Admitting: *Deleted

## 2009-06-04 ENCOUNTER — Emergency Department (HOSPITAL_COMMUNITY): Admission: EM | Admit: 2009-06-04 | Discharge: 2009-06-04 | Payer: Self-pay | Admitting: Emergency Medicine

## 2009-06-04 ENCOUNTER — Telehealth: Payer: Self-pay | Admitting: Internal Medicine

## 2009-07-17 ENCOUNTER — Telehealth: Payer: Self-pay | Admitting: Internal Medicine

## 2009-08-17 ENCOUNTER — Ambulatory Visit: Payer: Self-pay | Admitting: Internal Medicine

## 2009-08-31 ENCOUNTER — Telehealth: Payer: Self-pay | Admitting: Internal Medicine

## 2010-02-05 ENCOUNTER — Ambulatory Visit: Payer: Self-pay | Admitting: Internal Medicine

## 2010-06-25 ENCOUNTER — Telehealth: Payer: Self-pay | Admitting: Internal Medicine

## 2010-07-05 ENCOUNTER — Ambulatory Visit: Payer: Self-pay | Admitting: Internal Medicine

## 2010-07-05 ENCOUNTER — Encounter: Payer: Self-pay | Admitting: Internal Medicine

## 2010-07-05 VITALS — BP 104/72 | HR 105 | Temp 98.5°F | Wt 189.0 lb

## 2010-07-05 NOTE — Progress Notes (Signed)
Subjective:     Patient ID: Cynthia [name] is a 54 y.o. female.  HPI Here for uri sxs---resolved Several days ago she developed a cough. She is a long-term history of smoking. She does wheeze occasionally. She has not followed up with pulmonary function testing. She does use her inhalers when she gets sick but not on a routine basis. She otherwise has no complaints.  She is followed by psychiatry for her bipolar disorder. The following portions of the patient's history were reviewed and updated as appropriate: allergies, current medications, past family history, past medical history, past social history and problem list.  Review of Systems    no complaints Objective:   Physical Exam  Well-appearing female in no acute distress. She does appear significantly older than her stated age. HEENT exam atraumatic, normocephalic neck supple. Chest without increased work of breathing. She does have bilateral rhonchi and wheezing.

## 2010-07-05 NOTE — Assessment & Plan Note (Addendum)
I think this is the patient's main problem. She continues to smoke. She's not interested in quitting. She's not interested in further evaluation. I've advised her to quit smoking. I did discuss with her the possible options. She is not interested. At this time I don't think any further evaluation or change in medications necessary.

## 2010-08-04 ENCOUNTER — Encounter: Payer: Self-pay | Admitting: Gynecology

## 2010-08-13 NOTE — Assessment & Plan Note (Signed)
Summary: lung infection?//ccm   Vital Signs:  Patient profile:   55 year old female Weight:      190 pounds O2 Sat:      93 % on Room air Temp:     98.0 degrees F oral Pulse rate:   94 / minute BP sitting:   110 / 80  (left arm) Cuff size:   regular  Vitals Entered By: Kathrynn Speed CMA (February 05, 2010 11:33 AM)  O2 Flow:  Room air CC: ov lung infection?, cough, diffult to breath, src, COPD follow-up   CC:  ov lung infection?, cough, diffult to breath, src, and COPD follow-up.  History of Present Illness:  COPD Follow-Up      This is a 55 year old woman who presents for COPD follow-up.  The patient denies wheezing, cough, and increased sputum.  Symptoms occur daily.  The patient reports limitation of strenuous activities.  Current treatment includes annual flu shot.  Symptoms are worse with URIs.  has had worsening of sxs past 3-4 weeks continues to smoke  All other systems reviewed and were negative     Current Problems (verified): 1)  Tobacco User  (ICD-305.1) 2)  Gerd  (ICD-530.81) 3)  Depression  (ICD-311) 4)  Preventive Health Care  (ICD-V70.0) 5)  Bipolar Disorder Unspecified  (ICD-296.80) 6)  COPD  (ICD-496)  Current Medications (verified): 1)  Abilify 15 Mg Tabs (Aripiprazole) .... Take 1 Tablet By Mouth Once A Day 2)  Effexor Xr 75 Mg Xr24h-Cap (Venlafaxine Hcl) .... Take 5 Capsules Every Morning 3)  Nystatin-Triamcinolone 100000-0.1 Unit/gm-% Crea (Nystatin-Triamcinolone) .... As Needed 4)  Trazodone Hcl 50 Mg  Tabs (Trazodone Hcl) .... 4 Every Pm 5)  Adderall 20 Mg  Tabs (Amphetamine-Dextroamphetamine) .... 2 Every Am, 1 Every Pm 6)  Dilaudid in Implanted Pump 7)  Ranitidine Hcl 150 Mg Caps (Ranitidine Hcl) .... Two Times A Day 8)  Spiriva Handihaler 18 Mcg Caps (Tiotropium Bromide Monohydrate) .... Inhale Contents of One Capsule As Directed Daily  Allergies (verified): 1)  ! Codeine Phosphate Soluble  Past History:  Past Medical History: Last  updated: 02/11/2008 Anxiety Depression GERD-by hx 2009  Past Surgical History: Last updated: 2007/12/15 Appendectomy Cholecystectomy Lumbar laminectomy---total of 6 back surgeries  Family History: Last updated: 12-15-07 mother deceased--alzheimers-86 yo father deceased---colon CA---87 yo  Social History: Last updated: 12/15/2007 Single Current Smoker Alcohol use-no Regular exercise-no  Risk Factors: Exercise: no (Dec 15, 2007)  Risk Factors: Smoking Status: current (08/17/2009) Packs/Day: 0.75 (08/17/2009)  Physical Exam  General:  alert and well-developed.   Head:  normocephalic and atraumatic.   Eyes:  pupils equal and pupils round.   Ears:  R ear normal and L ear normal.   Neck:  No deformities, masses, or tenderness noted. Lungs:  normal respiratory effort, no intercostal retractions, and no accessory muscle use.  wheezing and few rhonchi bilaterally Heart:  normal rate and regular rhythm.     Impression & Recommendations:  Problem # 1:  TOBACCO USER (ICD-305.1)  Encouraged smoking cessation and discussed different methods for smoking cessation.   Problem # 2:  COPD (ICD-496) suspect her sxs are realted to COPD see new meds side effects discussed call if sxs persist  see studies ordered  advised to call for increased sputum production,  SOB, fever, etc... Her updated medication list for this problem includes:    Spiriva Handihaler 18 Mcg Caps (Tiotropium bromide monohydrate) ..... Inhale contents of one capsule as directed daily    Advair Diskus 100-50  Mcg/dose Misc (Fluticasone-salmeterol) .Marland Kitchen... 1 puff 2 times daily  Orders: T-2 View CXR (71020TC) Pulmonary Referral (Pulmonary)  Complete Medication List: 1)  Abilify 15 Mg Tabs (Aripiprazole) .... Take 1 tablet by mouth once a day 2)  Effexor Xr 75 Mg Xr24h-cap (Venlafaxine hcl) .... Take 5 capsules every morning 3)  Nystatin-triamcinolone 100000-0.1 Unit/gm-% Crea (Nystatin-triamcinolone) ....  As needed 4)  Trazodone Hcl 50 Mg Tabs (Trazodone hcl) .... 4 every pm 5)  Adderall 20 Mg Tabs (Amphetamine-dextroamphetamine) .... 2 every am, 1 every pm 6)  Dilaudid in Implanted Pump  7)  Ranitidine Hcl 150 Mg Caps (Ranitidine hcl) .... Two times a day 8)  Spiriva Handihaler 18 Mcg Caps (Tiotropium bromide monohydrate) .... Inhale contents of one capsule as directed daily 9)  Advair Diskus 100-50 Mcg/dose Misc (Fluticasone-salmeterol) .Marland Kitchen.. 1 puff 2 times daily  Patient Instructions: 1)  we will call you with appt for breathing tests Prescriptions: ADVAIR DISKUS 100-50 MCG/DOSE MISC (FLUTICASONE-SALMETEROL) 1 puff 2 times daily  #1 x 3   Entered and Authorized by:   Birdie Sons MD   Signed by:   Birdie Sons MD on 02/05/2010   Method used:   Electronically to        [facility] [phone][facility] (retail)       [address]       [place], [place]  [address]       Ph: [phone] or [phone]       Fax: [phone]   RxID:   [id number]

## 2010-08-13 NOTE — Progress Notes (Signed)
Summary: rx clairification  Phone Note From Pharmacy   Summary of Call: patient needs his rx for ranitidine two times a day #60 Initial call taken by: Kern Reap CMA Duncan Dull),  August 31, 2009 12:12 PM    Prescriptions: RANITIDINE HCL 150 MG CAPS (RANITIDINE HCL) two times a day  #60 x 5   Entered by:   Kern Reap CMA (AAMA)   Authorized by:   Birdie Sons MD   Signed by:   Kern Reap CMA (AAMA) on 08/31/2009   Method used:   Electronically to        [facility] [phone]* (retail)       [address]       [place], [place]  [address]       Ph: [phone] or [phone]       Fax: [phone]   RxID:   [id number]

## 2010-08-13 NOTE — Progress Notes (Signed)
Summary: REQ FOR REFILL  Phone Note Call from Patient   Caller: Patient @ [phone] Reason for Call: Refill Medication Summary of Call: Pt called to req a refill on med (RANITIDINE 150 MG CAPSULE), pt adv that pharmacy adv her that they have not received a response to their req for the med.... Pt was adv she would need to come in for an OV.... Pt made appt for Friday, Jan. 21, 2011 @ 11am..... However, pt adv that she is completely out of med and would like to have RX sent in for enough to do her till her OV. Initial call taken by: Debbra Riding,  July 17, 2009 1:55 PM  Follow-up for Phone Call        [facility]/16/09 [facility]..Patient notified.  Follow-up by: Gladis Riffle, RN,  July 18, 2009 2:05 PM

## 2010-08-13 NOTE — Assessment & Plan Note (Signed)
Summary: fu on meds/njr   Vital Signs:  Patient profile:   55 year old female Height:      67 inches Weight:      194 pounds BMI:     30.49 Pulse rate:   90 / minute Resp:     12 per minute BP sitting:   110 / 64  (left arm)  Vitals Entered By: Gladis Riffle, RN (August 17, 2009 12:23 PM) CC: FU meds--needs ranitidine Is Patient Diabetic? No   CC:  FU meds--needs ranitidine.  History of Present Illness:  Follow-Up Visit      This is a 55 year old woman who presents for Follow-up visit.  The patient denies chest pain and palpitations.  Since the last visit the patient notes no new problems or concerns and being seen by a specialist.  The patient reports taking meds as prescribed.  When questioned about possible medication side effects, the patient notes none.   GERD-no sxs on meds psych---sxs well controlled, follwed by psychiatrist  All other systems reviewed and were negative   Preventive Screening-Counseling & Management  Alcohol-Tobacco     Smoking Status: current     Smoking Cessation Counseling: yes     Packs/Day: 0.75  Current Medications (verified): 1)  Abilify 15 Mg Tabs (Aripiprazole) .... Take 1 Tablet By Mouth Once A Day 2)  Effexor Xr 75 Mg Xr24h-Cap (Venlafaxine Hcl) .... Take 5 Capsules Every Morning 3)  Nystatin-Triamcinolone 100000-0.1 Unit/gm-% Crea (Nystatin-Triamcinolone) .... As Needed 4)  Trazodone Hcl 50 Mg  Tabs (Trazodone Hcl) .... 4 Every Pm 5)  Adderall 20 Mg  Tabs (Amphetamine-Dextroamphetamine) .... 2 Every Am, 1 Every Pm 6)  Dilaudid in Implanted Pump 7)  Ranitidine Hcl 150 Mg Caps (Ranitidine Hcl) .... Two Times A Day  Allergies: 1)  ! Codeine Phosphate Soluble  Past History:  Past Medical History: Last updated: 02/11/2008 Anxiety Depression GERD-by hx 2009  Past Surgical History: Last updated: 2007-11-28 Appendectomy Cholecystectomy Lumbar laminectomy---total of 6 back surgeries  Family History: Last updated:  Nov 28, 2007 mother deceased--alzheimers-86 yo father deceased---colon CA---87 yo  Social History: Last updated: 11/28/07 Single Current Smoker Alcohol use-no Regular exercise-no  Risk Factors: Exercise: no (Nov 28, 2007)  Risk Factors: Smoking Status: current (08/17/2009) Packs/Day: 0.75 (08/17/2009)  Social History: Packs/Day:  0.75  Physical Exam  General:  alert and well-developed.   Head:  normocephalic and atraumatic.   Eyes:  pupils equal and pupils round.   Ears:  R ear normal and L ear normal.   Neck:  No deformities, masses, or tenderness noted. Lungs:  clear to auscultation with symmetric breath sounds Heart:  regular rhythm and rate Abdomen:  soft and non-tender.   Msk:  No deformity or scoliosis noted of thoracic or lumbar spine.   Pulses:  R radial normal and L radial normal.   Neurologic:  cranial nerves II-XII intact and gait normal.   Psych:  normally interactive and good eye contact.     Impression & Recommendations:  Problem # 1:  TOBACCO USER (ICD-305.1) advised absolute cessation she is not interested  Problem # 2:  GERD (ICD-530.81) controlled on meds continue current medications  Her updated medication list for this problem includes:    Ranitidine Hcl 150 Mg Caps (Ranitidine hcl) .Marland Kitchen..Marland Kitchen Two times a day  Orders: Prescription Created Electronically [phone])  Problem # 3:  COPD (ICD-496) pt understands sh has COPD she is not interested in quitting smoking or treating the disease she understands risks  Problem #  4:  BIPOLAR DISORDER UNSPECIFIED (ICD-296.80) f/u psychiatry  Complete Medication List: 1)  Abilify 15 Mg Tabs (Aripiprazole) .... Take 1 tablet by mouth once a day 2)  Effexor Xr 75 Mg Xr24h-cap (Venlafaxine hcl) .... Take 5 capsules every morning 3)  Nystatin-triamcinolone 100000-0.1 Unit/gm-% Crea (Nystatin-triamcinolone) .... As needed 4)  Trazodone Hcl 50 Mg Tabs (Trazodone hcl) .... 4 every pm 5)  Adderall 20 Mg Tabs  (Amphetamine-dextroamphetamine) .... 2 every am, 1 every pm 6)  Dilaudid in Implanted Pump  7)  Ranitidine Hcl 150 Mg Caps (Ranitidine hcl) .... Two times a day Prescriptions: RANITIDINE HCL 150 MG CAPS (RANITIDINE HCL) two times a day  #30 x 5   Entered by:   Gladis Riffle, RN   Authorized by:   Birdie Sons MD   Signed by:   Gladis Riffle, RN on 08/17/2009   Method used:   [facility]ron[facility] to        [facility] [phone][facility] (retail)       [address]       [place], [place]  [address]       Ph: [phone] or [phone]       Fax: [phone]   RxID:   [id number]   Prevention & Chronic Care Immunizations   Influenza vaccine: Not documented   Influenza vaccine deferral: Refused  (08/17/2009)    Tetanus booster: 12/28/2007: Tdap    Pneumococcal vaccine: Not documented  Colorectal Screening   Hemoccult: Not documented   Hemoccult action/deferral: Refused  (08/17/2009)    Colonoscopy: Not documented   Colonoscopy action/deferral: Refused  (08/17/2009)  Other Screening   Pap smear: Not documented   Pap smear action/deferral: Refused  (08/17/2009)    Mammogram: Not documented   Mammogram action/deferral: Refused  (08/17/2009)   Smoking status: current  (08/17/2009)   Smoking cessation counseling: yes  (08/17/2009)  Lipids   Total Cholesterol: Not documented   LDL: Not documented   LDL Direct: Not documented   HDL: Not documented   Triglycerides: Not documented

## 2010-08-15 NOTE — Progress Notes (Signed)
Summary: Call A Nurse   Call-A-Nurse Triage Call Report Triage Record Num: [id number] Operator: Sula Rumple Patient Name: Cynthia [name] Call Date & Time: 06/24/2010 6:19:11PM Patient Phone: [phone] PCP: Valetta Mole. Swords Patient Gender: Female PCP Fax : [phone] Patient DOB: 29-Dec-1955 [facility] Name[facility] Reason for Call: Pt calling on 06/24/10 states she feel today/hurt her Rt wrist/is able to move it/but painful/unable to grasp with her hand/arm is swollen/pt has pain/advised to go to ER Protocol(s) Used: Wrist Injury Recommended Outcome per Protocol: See Dorcus Riga within 4 hours Reason for Outcome: Severe pain with movement that limits normal activities Care Advice:  ~ 12/

## 2010-08-15 NOTE — Assessment & Plan Note (Signed)
Summary: F/U ON COPD - PT ADV NOT EMERGENT, F/U ONLY // RS   Vital Signs:  Patient profile:   55 year old female Weight:      189 pounds BMI:     29.71 O2 Sat:      93 % on Room air Temp:     98.5 degrees F oral Pulse rate:   105 / minute BP sitting:   104 / 72  (left arm) Cuff size:   regular  Vitals Entered By: Alfred Levins, CMA (July 05, 2010 12:10 PM)  O2 Flow:  Room air CC: cough, wheezing   CC:  cough and wheezing.  History of Present Illness: see note in [facility]  Current Medications (verified): 1)  Abilify 15 Mg Tabs (Aripiprazole) .... Take 1 Tablet By Mouth Once A Day 2)  Effexor Xr 75 Mg Xr24h-Cap (Venlafaxine Hcl) .... Take 5 Capsules Every Morning 3)  Adderall 20 Mg  Tabs (Amphetamine-Dextroamphetamine) .... 2 Every Am, 1 Every Pm 4)  Dilaudid in Implanted Pump 5)  Ranitidine Hcl 150 Mg Caps (Ranitidine Hcl) .... Two Times A Day 6)  Spiriva Handihaler 18 Mcg Caps (Tiotropium Bromide Monohydrate) .... Inhale Contents of One Capsule As Directed Daily 7)  Advair Diskus 100-50 Mcg/dose Misc (Fluticasone-Salmeterol) .Marland Kitchen.. 1 Puff 2 Times Daily 8)  Hydroxyzine Hcl 25 Mg Tabs (Hydroxyzine Hcl) .Marland Kitchen.. 1 By Mouth Once Daily 9)  Cyclobenzaprine Hcl 10 Mg  Tabs (Cyclobenzaprine Hcl) .Marland Kitchen.. 1 By Mouth 2 Times Daily As Needed For Back Pain  Allergies (verified): 1)  ! Codeine Phosphate Soluble   Complete Medication List: 1)  Abilify 15 Mg Tabs (Aripiprazole) .... Take 1 tablet by mouth once a day 2)  Effexor Xr 75 Mg Xr24h-cap (Venlafaxine hcl) .... Take 5 capsules every morning 3)  Adderall 20 Mg Tabs (Amphetamine-dextroamphetamine) .... 2 every am, 1 every pm 4)  Dilaudid in Implanted Pump  5)  Ranitidine Hcl 150 Mg Caps (Ranitidine hcl) .... Two times a day 6)  Spiriva Handihaler 18 Mcg Caps (Tiotropium bromide monohydrate) .... Inhale contents of one capsule as directed daily 7)  Advair Diskus 100-50 Mcg/dose Misc (Fluticasone-salmeterol) .Marland Kitchen.. 1 puff 2 times daily 8)   Hydroxyzine Hcl 25 Mg Tabs (Hydroxyzine hcl) .Marland Kitchen.. 1 by mouth once daily 9)  Cyclobenzaprine Hcl 10 Mg Tabs (Cyclobenzaprine hcl) .Marland Kitchen.. 1 by mouth 2 times daily as needed for back pain   Orders Added: 1)  Est. Patient Level III [16109]

## 2010-08-16 ENCOUNTER — Other Ambulatory Visit: Payer: Self-pay | Admitting: Internal Medicine

## 2010-08-16 DIAGNOSIS — J449 Chronic obstructive pulmonary disease, unspecified: Secondary | ICD-10-CM

## 2010-10-16 LAB — POCT I-STAT, CHEM 8
BUN: 14 mg/dL (ref 6–23)
Calcium, Ion: 1.15 mmol/L (ref 1.12–1.32)
Creatinine, Ser: 0.8 mg/dL (ref 0.4–1.2)
Hemoglobin: 17.7 g/dL — ABNORMAL HIGH (ref 12.0–15.0)
Sodium: 133 mEq/L — ABNORMAL LOW (ref 135–145)
TCO2: 29 mmol/L (ref 0–100)

## 2010-11-26 NOTE — Assessment & Plan Note (Signed)
[facility]                                 ON-CALL NOTE   Cynthia, [name]                     MRN:          [id number]  DATE:12/05/2006                            DOB:          March 06, 1956    PHONE NUMBER:  [phone]   PRIMARY CARE PHYSICIAN:  Valetta Mole. Swords, MD   HISTORY OF PRESENT ILLNESS:  She calls at 3:48 p.m. on May 24.  [name] has lung cancer.  Apparently has been coughing and Dr. Cato Mulligan  put her on some Avelox, presumably to treat what may be an underlying  infection.  She also takes Dilaudid by pump and by mouth, and she feels  like after her first dose of Avelox it made her kind of groggy and may  have accentuated the Dilaudid feeling.  She asked whether there is an  interaction between those two drugs.  I told her that I did not think  there was interaction that I knew of, but it might be better if she  takes her tomorrow's dose of Avelox in the evening, closer to bedtime,  incase there is some kind of increased sedation effect.     Karie Schwalbe, MD  Electronically Signed    RIL/MedQ  DD: 12/05/2006  DT: 12/05/2006  Job #: [id number]   cc:   Valetta Mole. Swords, MD

## 2010-11-26 NOTE — Assessment & Plan Note (Signed)
[facility]                             PULMONARY OFFICE NOTE   NAME:JOHNSONMaddalyn, Lutze                   MRN:          [id number]  DATE:12/15/2006                            DOB:          February 19, 1956    CHIEF COMPLAINT:  Cough/abnormal chest x-ray.   HISTORY:  This is a 55 year old white female, active smoker, with abrupt  onset of a congested cough  about two months ago that has persisted  since that time and has been treated with 7 days of Avelox, with partial  improvement, but never completely better.  She has never had fever,  pleuritic pain, or hemoptysis.  She underwent a chest x-ray on Nov 23, 2006 suggesting a left upper lobe infiltrate, with a follow-up CT scan  dated Nov 30, 2006 suggesting an area of left upper lobe air space  opacification with central cavitation and a few air bronchograms on the  edge of the lesion.  She is now seen at Dr. Cato Mulligan' request for  consideration for bronchoscopy.  However, note that she is feeling a bit  better overall since the onset of her illness.  She denies any  unintended weight loss, chills, or unusual exposure history.   PAST MEDICAL HISTORY:  1. Remote cholecystectomy.  2. Back surgery.  3. Appendectomy.   ALLERGIES:  CODEINE.   MEDICATIONS:  Taken in detail in the worksheet, dated December 15, 2006,  significant for multiple psychotropics.   SOCIAL HISTORY:  She continues to smoke 1-1/2 packs a day.  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   FAMILY HISTORY:  Negative for respiratory diseases or lung cancer.   REVIEW OF SYSTEMS:  Taken in detail in the worksheet and negative except  as outlined above.   PHYSICAL EXAMINATION:  GENERAL:  This is an animated white female with a  bit of an unusual affect and attitude.  VITAL SIGNS:  She is afebrile, with normal vital signs.  HEENT: Unremarkable.  She had upper and lower dentures in place.  Oropharynx is otherwise  clear.  NECK:  Supple, without cervical adenopathy or tenderness.  Trachea is  midline.  No thyromegaly.  LUNG FIELDS:  Reveal junky inspiratory and expiratory rhonchi  bilaterally.  HEART: Regular rate and rhythm, without murmur, gallop, or rub.  ABDOMEN: Soft, benign.  EXTREMITIES: Warm, without calf tenderness, cyanosis, clubbing, or  edema.   O2 saturation 92% on room air.   Follow-up chest x-ray shows marked improvement in aeration in the left  upper lobe.   IMPRESSION:  Probable community-acquired pneumonia that evolved to an  organizing pneumonia and now is resolving.  Most of the density seen  on CT scan is air space disease, not mass effect.  Therefore,  bronchoscopy at this point is optional.   What is really not optional in this setting is that the patient stop  smoking: she has extremely poor mucociliary clearance demonstrated here,  with a very congested-sounding cough.  I strongly recommend she commit  to quit smoking (based on the fact that she is on so many psychotropics,  though,  I believe she is going to have difficulty with this).  Try  Mucinex 2 b.i.d. p.r.n. cough and congestion.  I should say that  bronchoscopy is optional.   I discussed all the differential diagnoses with the patient in detail,  and she agreed to consider bronchoscopy versus return in six weeks for  follow-up chest x-ray.     Charlaine Dalton. Sherene Sires, MD, [facility]  Electronically Signed    MBW/MedQ  DD: 12/16/2006  DT: 12/16/2006  Job #: [id number]   cc:   Valetta Mole. Swords, MD

## 2010-11-26 NOTE — Assessment & Plan Note (Signed)
[facility]                                 ON-CALL NOTE   Cynthia [name], Cynthia [name]                     MRN:          [id number]  DATE:12/20/2006                            DOB:          1956-07-05    SUBJECTIVE:  Cynthia [name] was initially evaluated by Dr. Sherene Sires last week  for a suspected lung mass. She called on this day to report that she has  an intermittent moderate sense of shortness of breath which she  describes as difficulty catching her breath. These episodes last  transiently and resolve spontaneously. She has no other respiratory  symptoms and no other systemic symptoms.   PLAN:  I reassured her that this was likely related to the fact that she  is more aware of any respiratory symptoms given the current medical  evaluation that is being undertaken. However, if the symptoms persist,  become more frequent, become worse in severity, or if other major  respiratory symptoms develop, I emphasized that she needs to be  evaluated immediately and she is to call the answering service back,  otherwise she is to follow up with Dr. Sherene Sires as previously scheduled.     Oley Balm Sung Amabile, MD  Electronically Signed    DBS/MedQ  DD: 12/20/2006  DT: 12/21/2006  Job #: [id number]   cc:   Charlaine Dalton. Sherene Sires, MD, FCCP

## 2010-11-26 NOTE — Assessment & Plan Note (Signed)
[facility]                             PULMONARY OFFICE NOTE   NAME:JOHNSONIqra, Rotundo                   MRN:          [id number]  DATE:01/14/2007                            DOB:          September 08, 1955    PULMONARY FOLLOW-UP OFFICE VISIT:   HISTORY:  A 55 year old white female, active smoker, in for follow-up  evaluation of left upper lobe air space disease that occurred in a  setting suggesting an acute pneumonia.  She continues to smoke and  having difficulty with thick mucus production, especially in the  mornings with dyspnea with exertion but no change from baseline.  She  denies any pleuritic pain, fever, chills, orthopnea, PND, leg swelling,  or unintended weight loss.   For a full inventory of medications, please see face sheet, dated January 14, 2007.   PHYSICAL EXAMINATION:  GENERAL:  She is an ambulatory white female in no  acute distress.  VITAL SIGNS:  She is afebrile with stable vital signs.  HEENT:  Unremarkable.  Her oropharynx is clear.  LUNGS:  Lung fields reveal junky rhonchi, expiratory greater than  inspiratory bilaterally.  HEART:  Regular rhythm without murmur, rub or gallop.  ABDOMEN:  Soft, benign.  EXTREMITIES:  Warm without calf tenderness, clubbing, cyanosis or edema.   Chest x-ray was reviewed and is normal now.   IMPRESSION:  1. Complete resolution of air space disease, consistent with community-      acquired pneumonia in May, 2008.  No further followup needed.  2. I am concerned, however, about this patient's poor insight and      motivation, in terms of smoking cessation and deferred her back to      Dr. Cato Mulligan for further efforts towards committing and following      through on long-term smoking cessation.   In the meantime, the best medication for her, since she tends to have  chronic cough with mucociliary dysfunction and asthmatic bronchitis  would be Advair 250/50 b.i.d.  I spent extra time coaching her on  how to  use this effectively.  If it improves her symptoms of cough and dyspnea,  then I think it is resolve to continue this indefinitely.  If not, I do  not think there is much we can do for her in the pulmonary clinic in the  absence of a commitment to stop smoking, and pulmonary followup could be  p.r.n.    Charlaine Dalton. Sherene Sires, MD, [facility]  Electronically Signed   MBW/MedQ  DD: 01/14/2007  DT: 01/15/2007  Job #: [id number]   cc:   Valetta Mole. Swords, MD

## 2010-11-26 NOTE — Assessment & Plan Note (Signed)
[facility]                                 ON-CALL NOTE   ZUNAIRAH, DEVERS                     MRN:          [id number]  DATE:01/03/2007                            DOB:          03/08/1956    The patient called at 7:58 p.m. on January 03, 2007 complaining of ringing  in her ears.  She is a patient of Dr. Cato Mulligan.  She states the ringing in  her ears started today.  She has no other symptoms, no shortness of  breath, no chest pain, no dizziness, no allergy symptoms; she is just  wondering if it is something to be concerned about.  I recommended that  the patient call Dr. Cato Mulligan' office first thing in the morning to get an  appointment to be evaluated for this, but that it should be okay to  wait, unless she develops any other symptoms and in that case, she  should go to the emergency room to be evaluated.     Lelon Perla, DO  Electronically Signed    Shawnie Dapper  DD: 01/03/2007  DT: 01/04/2007  Job #: [id number]   cc:   Valetta Mole. Swords, MD

## 2010-11-29 NOTE — Assessment & Plan Note (Signed)
[facility]                                 ON-CALL NOTE   Cynthia, [name]                     MRN:          [id number]  DATE:09/21/2006                            DOB:          [birth year]    Phone number:  [phone].  Patient of Dr. Cato Mulligan.  The phone call was at 6:03 p.m. on March 10.   [name] fell, was waiting all day for a phone call back from her  psychiatrist, who I guess she called because she has been dizzy-headed  and kind of losing time.  She will be conscious of her situation, then  suddenly she has like an amnestic reaction and does not remember what  happened or where some of the time that passed by.  She reports a fall  today and hit her head and hit some other places too.  She called for  advice.   PLAN:  I told her she may just have a concussion but with those kinds of  lapses and cognitive changes, she needed immediate neurologic  evaluation.  I asked her to call 9-1-1, get transported to the emergency  room for emergency evaluation.     Karie Schwalbe, MD  Electronically Signed    RIL/MedQ  DD: 09/21/2006  DT: 09/22/2006  Job #: [id number]   cc:   Valetta Mole. Swords, MD

## 2010-11-29 NOTE — Discharge Summary (Signed)
NAME:  Cynthia [name], Cynthia [name] NO.:  [id number]   MEDICAL RECORD NO.:  [id number]          PATIENT TYPE:  IPS   LOCATION:  0404                          [facility]ACILITY[facility]  [facility]   PHYSICIAN:  Geoffery Lyons, M.D.      DATE OF BIRTH:  [birth year]   DATE OF ADMISSION:  08/29/2004  DATE OF DISCHARGE:  08/31/2004                                 DISCHARGE SUMMARY   CHIEF COMPLAINT/HISTORY OF PRESENT ILLNESS:  This was at least the third  admission to [facility] for this 55 year old divorced  white female voluntarily admitted.  She presented with history of suicidal  ideations, sent by her psychiatrist.  She had an appointment with Dr. Jacqulyn Bath.  She apparently had a good appointment followup, but then she came back at  5:00 p.m. reporting positive auditory hallucinations and suicidal ideas.  Patient attempted to elope upon arrival, refusing to cooperate.  She  received sedation in the emergency room, requesting pain medications.  Unable to provide much history.   PAST PSYCHIATRIC HISTORY:  Third time here at [facility].  Seeing Dr. Jacqulyn Bath.   ALCOHOL OR DRUG HISTORY:  Denies the use or abuse of any substances.   PAST MEDICAL HISTORY:  Chronic back pain.   MEDICATIONS:  1.  Neurontin 400 mg 2 tablets four times a day.  2.  Effexor XR 150 mg 2 at night.  3.  Remeron 30 at night.  4.  Ultram 50 as needed.  5.  Keppra 500 mg 1 twice daily.   PHYSICAL EXAMINATION:  Performed with no acute findings.   LABORATORY WORKUP:  Blood chemistries, glucose 103.  Liver enzymes within  normal limits.  THS 1.889.   MENTAL STATUS EXAM:  Reveals a female in bed, covered by the bed sheets.  Brief interactions saying yes or no or I don't know.  No spontaneous,  reserved, guarded.  Thought process unable to evaluate, no spontaneous  content.  Limited to responding in a yes or no fashion.  Very poor  historian.  Claims not remembering what happened.   ADMISSION DIAGNOSES:   AXIS I:  1.  Rule out opiate and benzodiazepine abuse.  2.  Mood disorder not otherwise specified.  3.  Rule out dissociative disorder not otherwise specified.   AXIS II:  No diagnosis.   AXIS III:  1.  Chronic back pain.  2.  Seizures.   AXIS IV:  Moderate.   AXIS V:  Upon admission 25, highest in last year 60.   COURSE IN HOSPITAL:  She was admitted, started on individual and group  psychotherapy.  She was given Ambien for sleep.  She was detoxified with  clonidine.  She was given Ativan 0.5 mg every 6 hours as needed.  Neurontin  800 mg four times a day, Nexium 40 mg per day, Avinza 30 mg twice daily,  Adderall XL 25 mg three times a day, Effexor XL 375 mg daily.  She had to be  given Geodon 20 mg intramuscularly.  She was given Flexeril 5 three times a  day as needed for muscle spasm.  Neurontin  100 four times a day, Seroquel  100 mg every 6 hours as needed.  She was given Imitrex for headaches,  Adderall was discontinued.  She had several episodes of agitation requiring  IM medications.  She had to be given Haldol 5 and Ativan 1 mg IM.  She was  also treated with Geodon.  She was switched from Seroquel and Geodon to  Risperdal.  She was initially admitted.  She claimed that she had some  changes of medication but was not sure what they were.  Initially she was  deemed to be psychotic, refusing to enter __________, escalated.  She had to  be restrained and placed in a quiet room.  She was eventually taken off  restraints.  Initially sleeping at intervals but uncooperative when aroused.  On August 30, 2004 she became agitated and broke the glass in the ward 400  door.  She claimed that the voices were telling to her that she had to leave  she had to be restrained again  As she got to a point where she could not be  handled safely in our unit, she was combative and agitated we went ahead and  requested a transfer to [facility] for further stabilization.   DISCHARGE  DIAGNOSES:   AXIS I:  1.  Mood disorder, not otherwise specified  2.  Psychotic disorder, not otherwise specified.  3.  Polysubstance abuse.   AXIS I:  Borderline personality disorder.   AXIS III:  Back pain.   AXIS IV:  Moderate.   AXIS V:  On discharge 35-40.   DISPOSITION:  She is being transferred to [facility] for further  stabilization.      IL/MEDQ  D:  10/01/2004  T:  10/02/2004  Job:  [id number]

## 2010-11-29 NOTE — H&P (Signed)
NAME:  Cynthia [name], Cynthia [name] NO.:  [id number]   MEDICAL RECORD NO.:  [id number]          PATIENT TYPE:  EMS   LOCATION:  ED                           FACILITY:  [facility]   PHYSICIAN:  Jackie Plum, M.D.DATE OF BIRTH:  1956-06-19   DATE OF ADMISSION:  10/31/2004  DATE OF DISCHARGE:                                HISTORY & PHYSICAL   CHIEF COMPLAINT:  Drug overdose.   HISTORY:  Patient presented to the ER after ingestion of 8-10 tablets of  doxepin and 10 tablets of Neurontin.  Two hours after this ingestion she  came in drowsy with slurred speech.  Patient is very drowsy from the  medicines at this point and unable to give any consistent meaningful history  but she indicates that she took the medicines because she wanted to sleep.  She had been under a lot of stress according to the patient and has not been  able to sleep.  She denies suicidal ideation.  She denies any chest pain,  shortness of breath and abdominal pain.  No other history is available at  this point.   PAST MEDICAL HISTORY:  According to ER records patient has history of  depression and back pain.  She is said to smoke two cigarettes.  Her current  medications are unclear.  Her social history is unclear but according to ED  records patient apparently smokes two packs of cigarettes daily and she  lives alone.   REVIEW OF SYSTEMS:  Unable to be obtained.   PHYSICAL EXAMINATION:  VITAL SIGNS:  Temperature 97.2, BP 90/50, pulse 86,  respirations 24, however, I counted 18 per minute, O2 saturation of 98%.  GENERAL EXAMINATION:  Patient is drowsy from medicines however, she is  easily aroused from calls of her name.  She moves all her extremities.  HEAD AND NECK:  Pupils were equal, round, reactive to light.  Extraocular  movements were intact.  She had normocephalic, atraumatic head.  No  conjunctival pallor or icterus.  Oropharynx was moist.  No exudates or  erythema.  Neck was supple.  No JVD.  CHEST:  Lungs were clear to auscultation bilaterally.  Cardiac regular rate  and rhythm, no gallops or murmurs.  ABDOMEN:  Abdomen soft, bowel sounds were present.  EXTREMITIES:  No cyanosis, no edema.   LABORATORY WORK:  Twelve-lead EKG shows sinus rhythm at 98 per minute  without any acute ST wave changes.  UA:  Color was yellow, appearance clear,  specific gravity of 1.006, pH of 7, glucose negative, hemoglobin negative,  bilirubin negative, ketones negative, protein negative, nitrite negative,  leukocyte esterase negative, urobilinogen 0.2.  WBC count 6, hemoglobin  12.8, hematocrit 37.5, MCV 83.4, platelet count 294.  Sodium 141, potassium  3.8, chloride 111, CO2 26, glucose 122, BUN 4, creatinine 0.8, calcium 8.8,  total protein 5.9, albumin 3.6.  Drug of abuse screen negative for opiates,  cocaine, benzodiazepines, barbiturates and tetrahydrocannabinoids and  positive for amphetamines.  Acetaminophen  level was less than 10 on  presentation and salicylate level was also less than 4 on presentation and  this was at  1737 hours.  Repeat levels at 1917 hours salicylate level was  less than 4 and acetaminophen was less than 10.  Alcohol level was less than  5.   IMPRESSION:  Multiple drug overdose including tricyclic.   PLAN:  Admit patient to stepdown bed with supportive care, give IV fluids,  oxygen.  She will need monitoring of her electrolytes and EKG for any  prolonged PR or QRS widening.  She will monitored with seizure precautions  in place.  Patient will be evaluated by psychiatric service tomorrow when  her mental status clears.      GO/MEDQ  D:  10/31/2004  T:  10/31/2004  Job:  [id number]

## 2010-11-29 NOTE — Discharge Summary (Signed)
NAME:  Cynthia [name], KLIER NO.:  [id number]   MEDICAL RECORD NO.:  [id number]          PATIENT TYPE:  IPS   LOCATION:  0405                          [facility]ACILITY:  [facility]   PHYSICIAN:  Geoffery Lyons, M.D.      DATE OF BIRTH:  11-30-1955   DATE OF ADMISSION:  04/27/2004  DATE OF DISCHARGE:  05/03/2004                                 DISCHARGE SUMMARY   CHIEF COMPLAINT AND PRESENT ILLNESS:  This was one of several admissions to  [facility] for this 55 year old white female,  involuntarily committed.  History of intentional overdose on Lunesta, Xanax  and other pills.  She said that she was not able to recollect what happened  when she overdosed.  History of chronic back pain, history of doctor  shopping, early refills.   PAST PSYCHIATRIC HISTORY:  Hospitalized previously in [place] as well as  [facility].  The pharmacy stated that she doctor shops.  Psychiatric  admissions more than she can count, as per her own report.   ALCOHOL/DRUG HISTORY:  Denies alcohol or drug use, but there is a prior  history of the same.  Very guarded about this information.   PAST MEDICAL HISTORY:  Chronic back pain.   MEDICATIONS:  1.  Lunesta 2 mg at night.  2.  Remeron 15 mg at night.  3.  Provigil 200.  4.  Prinivil.  5.  Neurontin 800 three times a day.  6.  Vistaril 2 mg at night.  7.  Robaxin 750 one three times a day.   PHYSICAL EXAMINATION:  Performed and failed to show any acute findings.   LABORATORY WORKUP:  CBC within normal limits.  Blood chemistries:  Glucose  150, repeated 117.  Liver profile within normal limits.  Urine drug screen  upon admission to [facility] was positive for opiates and  benzodiazepines.   MENTAL STATUS EXAM:  Reveals an unsteady, uncooperative female.  Feet are  over her head.  No eye contact, disheveled.  Speech brief, soft spoken.  Mood anxious, depressed, agitated.  Affect avoidant.  Thought process not as  spontaneous, limits to answer some of what she is asked for.  Cognition  aware of self, very guarded about the information.   ADMISSION DIAGNOSES:   AXIS I:  1.  Major depression, recurrent.  2.  Rule out opiate abuse/opiate dependence.   AXIS II:  No diagnosis.   AXIS III:  Back pain.   AXIS IV:  Moderate.   AXIS V:  Global Assessment of Functioning upon admission 30; highest Global  Assessment of Functioning in the last year 60.   COURSE IN HOSPITAL:  She was admitted and started in individual and group  psychotherapy.  She was placed on a Librium protocol due to the fact that  she had overdosed on benzodiazepines.  She was apparently taking Effexor and  she was restarted on Effexor XR 12.5 mg daily.  She was given some Risperdal  M-Tab 0.5 on a p.r.n. basis.  She was restarted on Neurontin 1600 mg per  day.  She claimed it was  for seizures.  Neurontin was changed to 800 three  times a day.  She complained of nausea.  She was given Zofran.  She was  replaced potassium. She was placed on Zyprexa 2.5 twice a day and Zyprexa 10  at bedtime.  Initially, as already stated, very guarded, very reversed, not  giving much information.  She was pretty unsteady on her feet, easily  agitated.  At one time she required seclusion.  There were some episodes of  confusion, agitation.  She claimed she did not know what was going on.  She  was placed on one-on-one for safety.  The episode where she was secluded she  claimed that she threw a tantrum.  As the hospitalization progressed, she  seemed to get better.  She was able to contract for safety.  She was steady  on her feet.  By October the 20th she was saying she was feeling better.  Initially had endorsed hallucinations, but the Zyprexa seemed to be helpful  and by October the 21st she was in full contact with reality.  There were no  suicide ideas, no homicide ideas, no hallucinations, no delusions.  She was  more stable.  She felt she was  ready to go home, feeling much better.  She  was going back to her place, but because she had to move out during the  weekend she had no other place to go.  She said that she could handle the  stress of what she was facing.   DISCHARGE DIAGNOSES:   AXIS I:  1.  Major depression with psychotic features.  2.  Opiate abuse.   AXIS II:  Borderline personality disorder.   AXIS III:  Back pain.   AXIS IV:  Moderate.   AXIS V:  Global Assessment of Functioning upon discharge 50.   DISCHARGE MEDICATIONS:  1.  Protonix 40 mg per day.  2.  Duragesic patch 25 per hour every 72 hours.  3.  Effexor XR 75 mg in the morning, 37.5 mg in the afternoon.  4.  Neurontin 400 two to three times a day.  5.  Zyprexa 2.5 twice a day.  6.  Zyprexa 10 at bedtime.   FOLLOW UP:  At [facility].     Farrel Gordon   IL/MEDQ  D:  05/30/2004  T:  05/31/2004  Job:  [id number]

## 2010-11-29 NOTE — Assessment & Plan Note (Signed)
[facility]                           GASTROENTEROLOGY OFFICE NOTE   Cynthia [name], Cynthia [name]                   MRN:          [id number]  DATE:04/15/2006                            DOB:          Oct 03, 1955    REASON FOR REFERRAL:  Chronic constipation and a family history of colon  cancer.   HISTORY OF PRESENT ILLNESS:  Cynthia [name] is a 55 year old white female,  referred through the courtesy of Dr. Birdie Sons.  She has had difficulties  with constipation for many years that are felt related to medications.  These problems have been stable, and using Miralax on a p.r.n. basis has  been helpful for her.  She does have occasional gas and bloating.  She does  have intermittent heartburn that she treats with Prilosec, with good control  of symptoms.  Over the past 24 to 48 hours, she has noted some mild nausea  with malaise and low grade fevers to about 100 degrees Fahrenheit.  She has  not had any vomiting or abdominal pain.  These symptoms are fairly minimal,  and appear to have improved over the course of today.  Her father was  diagnosed at age 60 with colon cancer.  There are no other family members  with colon cancer, colon polyps or inflammatory bowel disease.  She states  she had a colonoscopy and endoscopy performed at [facility] about  10 to 15 years ago, she does not recall the findings, the physician, or the  reasons for the procedure.  She has had no change in stool caliber, melena,  hematochezia, abdominal pain or rectal pain.   PAST MEDICAL HISTORY:  1. Status post cholecystectomy.  2. Status post appendectomy.  3. Depression.  4. Hyperlipidemia.  5. Severe spinal stenosis at L3-L4.  6. History of compression fractures.  7. History of degenerative disk disease at L4-5 and L5-S1.  8. Status post 7 back surgeries.  9. Chronic pain syndrome with an implanted pump for management of back      pain.   CURRENT MEDICATIONS:   Listed on the chart, updated and reviewed.   MEDICATION ALLERGIES:  CODEINE, leading to vomiting.   Social history and review of systems per the diagnostic evaluation form.   PHYSICAL EXAMINATION:  In no acute distress.  Height 5 feet 10 inches,  weight 182.8 pounds.  Blood pressure is 112/68, pulse 72 and regular,  temperature is 98.7.  HEENT:  Anicteric sclerae.  Oropharynx is clear.  CHEST:  Clear to auscultation bilaterally.  CARDIAC:  Regular rate and rhythm without murmurs appreciated.  ABDOMEN:  Soft, nontender, nondistended.  Normal active bowel sounds, no  palpable organomegaly, masses or hernias.  The implanted pump is palpable in  the suprapubic region.  RECTAL:  Examination deferred to time of colonoscopy.  EXTREMITIES:  Without clubbing, cyanosis or edema.  NEUROLOGIC:  Alert and oriented x3.  Grossly nonfocal.   ASSESSMENT AND PLAN:  1. First-degree relative with colon cancer at age 72.  2. Chronic stable constipation.  Rule out colorectal neoplasms.  Risks,      benefits and alternatives to  colonoscopy with possible biopsy and      possible polypectomy discussed with the patient, and she consents to      proceed.  She was advised that due to her medications, conscious      sedation may not be as effective as it would be in the average person.      She may not have amnesia or complete pain control. She understands      this, and is willing to proceed.  3. Nausea associated with low grade fevers.  Rule out viral syndrome.  She      is advised to maintain a low fat diet, and use Prilosec on a daily      basis for the next several days.  If these symptoms worsen, she can      contact us or Dr. Cato Mulligan.       Cynthia [name]. Russella Dar, MD, [facility]      MTS/MedQ  DD:  04/15/2006  DT:  04/17/2006  Job #:  [id number]   cc:   Valetta Mole. Swords, MD

## 2010-11-29 NOTE — Op Note (Signed)
NAME:  Cynthia [name], Cynthia [name]                      ACCOUNT NO.:  [id number]   MEDICAL RECORD NO.:  [id number]                   [name] TYPE:  AMB   LOCATION:  DSC                                  FACILITY:  [facility]   PHYSICIAN:  Hewitt Blade, D.D.S.             DATE OF BIRTH:  December 29, 1955   DATE OF PROCEDURE:  04/11/2003  DATE OF DISCHARGE:                                 OPERATIVE REPORT   PREOPERATIVE DIAGNOSES:  1. Nonrestorable teeth #2, 4, 12, 22, 26, 27, 29.  2. History of chronic psychotropic medication use.   POSTOPERATIVE DIAGNOSES:  1. Nonrestorable teeth #2, 4, 12, 22, 26, 27, 29.  2. History of chronic psychotropic medication use.   SURGERY PERFORMED:  Removal of Cynthia above teeth.   SURGEON:  Hewitt Blade, D.D.S.   FIRST ASSISTANT:  [place].   ANESTHESIA:  General via oroendotracheal intubation.   ESTIMATED BLOOD LOSS:  Less than 25 mL.   FLUIDS REPLACED:  Approximately 1000 mL crystalloid solution.   COMPLICATIONS:  None apparent.   INDICATION FOR PROCEDURE:  Cynthia [name] is a 55 year old female who was  referred to my office for removal of Cynthia above teeth.  Cynthia [name] presented  with a history of chronic narcotic, sedative, and amphetamine usage for  chronic nervous disorders.  It was recommended due to this that Cynthia  procedure be performed under operating room conditions.   DETAILS OF PROCEDURE:  On April 11, 2003, Cynthia [name] was taken to [facility] day surgical center, where she was placed on Cynthia operating room  table in a supine position.  Following successful oroendotracheal intubation  and general anesthesia, Cynthia [name]'s face, neck, and oral cavity were  prepped and draped in Cynthia usual sterile operating room fashion.  Cynthia  hypopharynx was suctioned free of fluids and secretions and a moistened two-  inch vaginal pack was placed as a throat pack.  Attention was then directed  intraorally, where approximately 14 mL of 0.5% Xylocaine  containing  1:200,000 epinephrine was infiltrated in Cynthia left inferior alveolar nerve  distributions and Cynthia palatal and buccal soft tissues around Cynthia maxillary  dentition.   A #15 Bard Parker blade was then used to create a full-thickness  mucoperiosteal incision around Cynthia necks of Cynthia decayed teeth.  A full-  thickness mucoperiosteal flap was then elevated using a #9 Molt periosteal  elevator.  Cynthia teeth were then subluxated from Cynthia alveolus and removed from  Cynthia oral cavity using a 150 dental forceps in Cynthia maxillary arch and a 151  dental forceps in Cynthia mandibular arch.  Cynthia bony margins were then smoothed  with a Stryker rotary osteotome and a #8 round bur with thorough irrigation.  Cynthia surgical sites were then thoroughly irrigated with sterile saline  irrigating solutions and suctioned.  Cynthia mucoperiosteal margins were  approximated and sutured in an interrupted fashion using 4-0 chromic suture  material on a PS2 needle.  Cynthia oral cavity was then thoroughly irrigated and  suctioned.  Cynthia throat pack was removed, and Cynthia hypopharynx was suctioned  free of fluids and secretions.  Cynthia [name] was allowed to awaken from Cynthia  anesthesia and taken to Cynthia recovery room, where she tolerated Cynthia procedure  well without apparent complications.                                                Hewitt Blade, D.D.S.    DC/MEDQ  D:  04/11/2003  T:  04/11/2003  Job:  [id number]

## 2010-11-29 NOTE — Discharge Summary (Signed)
NAME:  Cynthia [name], Cynthia [name] NO.:  [id number]   MEDICAL RECORD NO.:  [id number]          PATIENT TYPE:  INP   LOCATION:  0354                         FACILITY:  [facility]   PHYSICIAN:  Theone Stanley, MD   DATE OF BIRTH:  1956/03/03   DATE OF ADMISSION:  04/25/2004  DATE OF DISCHARGE:  04/27/2004                                 DISCHARGE SUMMARY   PRIMARY CARE PHYSICIAN:  Meredith Staggers, M.D.   CONSULTATIONS:  Psychiatry.   HOSPITAL COURSE:  [name] is a 55 year old Caucasian female with a past  medical history significant for depression who was brought into the  emergency room by EMS accompanied by police after taking 26 tablets of 2-mg  Lunesta, 5 tablets of 1-mg Xanax, and 20 30-mg morphine tablets. The patient  stated that she wanted to hurt herself and admitted to suicide ideation. Per  ER physician, the patient was sullen and lethargic on arrival. ER drug  screen was positive for opiates and benzodiazepines. She was given naloxone,  and she became more awake, also more agitated. By the time Dr. Suanne Marker had  arrived to the ER, she was in restraints, awake but disoriented but was  unable to give a history. The history is obtained from the chart and ER  staff; however, the patient denied any chest pain, cough, dysuria,  hematochezia, nausea, vomiting, hematemesis. The patient was admitted to the  ICU for observation. During her initial work up, a chest x-ray was performed  which was negative. During her stay in ICU, the patient's vital signs  remained stable. Her heart was normal sinus rhythm. Blood pressure remained  above 100 systolic.   LABORATORY DATA:  White count was 5, hemoglobin 13, hematocrit 41, platelets  276. Electrolytes including liver enzymes:  Sodium 137, potassium 4.2,  chloride 106, CO2 28, glucose 102, BUN 4, creatinine 6. Total bilirubin-  indirect and direct, AST, ALT, alkaline phosphatase, protein, albumin and  calcium were all normal.  Acetaminophen level was less than 10, and a  salicylate level was less than 4. Alcohol level was 5.   The patient remained stable through her stay here in the hospital, and when  psychiatry evaluated her, overall impression was patient was withdrawn and  nervous, still resistant historian. She was able to reason and agrees to  inpatient psychiatry ward admission for further evaluation and treatment.  Because she is medically stable, she will be transferred to behavioral  health for further psychiatric treatment.   MEDICATIONS:  Her medications while in the hospital were limited to  Protonix, morphine, Effexor, and Ativan; however, her medications on the  outside which she should get at behavioral health are:  1.  Provigil 200 two tablets q.a.m.  2.  Remeron 15 mg q.h.s.  3.  Neurontin 800 mg 1 p.o. t.i.d.  4.  Effexor 150 mg 2 tablets q.h.s.  5.  Morphine sulfate instant release 30 mg 1 p.o. b.i.d.  6.  Methocarbamol 750 mg 1 p.o. t.i.d.  7.  Lorazepam 1 mg b.i.d.  8.  Restoril 2 mg 1 p.o. q.a.m. and 2 mg 2 tablets  q.p.m.   The patient left the hospital in stable condition.      AEJ/MEDQ  D:  04/27/2004  T:  04/27/2004  Job:  [id number]

## 2010-11-29 NOTE — H&P (Signed)
NAME:  ARRA, CONNAUGHTON NO.:  [id number]   MEDICAL RECORD NO.:  [id number]          PATIENT TYPE:  INP   LOCATION:  0102                         FACILITY:  [facility]   PHYSICIAN:  Kela Millin, M.D.DATE OF BIRTH:  16-Oct-1955   DATE OF ADMISSION:  04/25/2004  DATE OF DISCHARGE:                                HISTORY & PHYSICAL   PRIMARY CARE PHYSICIAN:  Meredith Staggers, M.D.   CHIEF COMPLAINT:  Drug overdose per EMS.   HISTORY OF PRESENT ILLNESS:  The patient is a 55 year old white female with  a past medical history significant for depression, who was brought to the  emergency room by EMS, accompanied by the police, after taking about 26  tablets of 2 mg Lunesta tablets, and 5 tablets of 1 mg Xanax, and 20  morphine tablets.  The patient stated that she wanted to hurt herself,  admitted to suicidal ideation.   Her ER physician, the patient was somnolent/lethargic upon arrival in the  ER.  Urine drug screen was positive for opiates and benzodiazepines, and the  patient was given Naloxone, and she became more awake, but also more  agitated.  At the time I saw the patient in the ER, she was in restraints,  awake but disoriented, and was unable to give a history - the history is  obtained from chart review and ER staff.  No chest pain, cough, dysuria,  hematochezia, nausea, vomiting, or hematemesis reported.  The patient is  admitted to the [facility] hospitalist service for further evaluation and  management.   PAST MEDICAL HISTORY:  As above.  Otherwise, unable to obtain.   MEDICATIONS:  1.  Effexor.  2.  Neurontin.  (Listed on chart.)   ALLERGIES:  1.  CODEINE, as listed on chart.   SOCIAL HISTORY:  No alcohol, and no drug use, as listed per chart.  Lives  alone, as stated per chart.   FAMILY HISTORY:  Unable to obtain.   REVIEW OF SYSTEMS:  As per HPI.  Otherwise, unable to obtain.   PHYSICAL EXAMINATION:  GENERAL:  The patient is a middle-aged  white female,  in restraints, awake, disoriented, in no acute distress.  VITAL SIGNS:  Temperature 98.5, blood pressure 99/71, pulse 94, respiratory  rate 20, O2 saturation 99%.  HEENT:  Pupils equal, round and reactive to light.  Extraocular movements  intact.  Sclerae are anicteric.  Moist mucous membranes.  NECK:  Supple.  No adenopathy.  No thyromegaly.  No JVD.  No carotid bruits.  LUNGS:  Clear to auscultation bilaterally.  No crackles, no wheezes.  CARDIOVASCULAR:  Regular rate and rhythm.  Normal S1 and S2.  No murmurs.  ABDOMEN:  Soft.  Bowel sounds present.  Nontender, nondistended.  No  organomegaly.  No masses palpable.  EXTREMITIES:  No clubbing, cyanosis, or edema.  NEUROLOGIC:  Awake, not following commands, disoriented.  Moving all  extremities grossly.   LABORATORY DATA:  Her white cell count is 5.8, hemoglobin 13.5, hematocrit  41%, platelet count 276, neutrophil count 50%.  Sodium is 135, potassium 4,  chloride 106, CO2 of 28,  glucose 106, BUN 4, creatinine 0.6, calcium 0.8.  Her LFT's are within normal limits.  Her Tylenol level is less than 10.  Salicylate level is less than 4.  A urine drug screen is positive for  benzodiazepines and opiates.  Her alcohol level is 5.   ASSESSMENT AND PLAN:  1.  Drug overdose.  Per EMS, the patient took morphine tablets, Lunesta, and      Xanax.  She admitted to suicidal ideations, and in the ER was given      Naloxone, and became more alert and agitated, as discussed above.  Will      admit the patient to the step-down unit for close monitoring, one-on-one      observation/suicide precautions.  Will hydrate with IV fluids,      supportive care.  Will recheck EKG in the a.m.  I consulted psychiatry;      Dr. Jeanie Sewer will see the patient in the a.m.  2.  Depression.  Psychiatric consult, as already discussed above.  3.  Hypertension.  Mild, likely secondary to problem #1.  The patient is      afebrile with no leukocytosis.  EKG  with no ST-T changes.  Will hydrate,      as already discussed above, and follow.      ACV/MEDQ  D:  04/26/2004  T:  04/26/2004  Job:  [id number]   cc:   Meredith Staggers, M.D.  [address], [address]  [place]  [place] [address]  Fax: [phone]

## 2010-11-29 NOTE — Discharge Summary (Signed)
NAME:  Cynthia [name], Cynthia [name] NO.:  [id number]   MEDICAL RECORD NO.:  [id number]          PATIENT TYPE:  IPS   LOCATION:  0301                          [facility]ACILITY:  [facility]   PHYSICIAN:  Jeanice Lim, M.D. DATE OF BIRTH:  1955-09-03   DATE OF ADMISSION:  08/14/2004  DATE OF DISCHARGE:  08/19/2004                                 DISCHARGE SUMMARY   IDENTIFYING INFORMATION:  This is a 55 year old divorced Caucasian female  voluntarily admitted for history of intentional overdose, reported her  nerves were bad, was trying to calm her nerves down, taking more Neurontin,  did not know how much she had taken and called her psychiatrist who then  called 911.  The patient reports she had been hearing auditory  hallucinations telling her that she has been patient long enough.  She does  not want to lose control.  She has a history of being very violent when she  is upset.  In-between pain management people for control of chronic back  pain.   PAST PSYCHIATRIC HISTORY:  First admission to [facility].  Sees a Dr. Tomasa Rand as outpatient psychiatrist.  History of cutting in the  past.  To clarify, patient saw Dr. Tomasa Rand one time at most 2 times and  Dr. Tomasa Rand contacted the unit and said that he no longer wanted to  follow this patient and since she was safe in the hospital, he was going to  discharge her from his practice.   ALCOHOL OR DRUG HISTORY:  Patient denied a history of alcohol or drug use.  Uses one pack per day for 25 years.   PRIMARY CARE PHYSICIAN:  Denied having a primary care physician, reports she  is in-between pain management people.   MEDICATIONS ON ADMISSION:  1.  Neurontin 800 mg q.i.d.  2.  Xanax 1 mg q.i.d.  3.  Avinza.  4.  Nexium.  5.  Morphine sulfate immediate release 30 mg b.i.d.  6.  Adderall XL 20 mg t.i.d.  7.  Effexor 300 mg daily.   DRUG ALLERGIES:  CODEINE and HALDOL.   PHYSICAL EXAMINATION:  Her physical and  neurologic exam essentially within  normal limits.   ROUTINE ADMISSION LABS:  Normal EKG.  Urine drug screen positive for benzos  and amphetamines.  Patient did require 20 mg of Geodon in the emergency room  due to agitation.  Poison Control was notified about patient's overdose.   MENTAL STATUS EXAM:  Middle-aged female with poor eye contact.  Head down.  Holding up her leg, complaining of pain.  Focused primarily on getting pain  medicines.  Reporting that she did not need to be here that the overdose was  not significant, and that she mainly needed her pain treated, that she had a  psychiatrist and had to be informed that she is not able to continue  following this psychiatrist.  She did not seem to be upset by this.  Apparently she had seen many psychiatrists who had discharged her in the  past.  Affect was irritable and demanding.  Somewhat manipulative with  changing details of her  symptoms and recent history.  Questionable  historian.  Cognition grossly intact.  Judgment and insight are quite poor.  Some impairment clearly appears to be from a severe personality disorder.  Other possibilities would be ruling out PTSD or dissociative disorder in  addition to personality disorder.   ADMISSION DIAGNOSES:   AXIS I:  1.  Rule out benzodiazepine and opiate abuse, patient dependent as well as      amphetamine dependence.  2.  Rule out substance induced mood disorder.  3.  Possible bipolar II versus major depression, recurrent, moderate,      clearly not a reliable historian, somewhat volitionally manipulating      details.  Could rule out malingering, however, patient does appear to      have true pathology in addition to her personality issues.   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   AXIS III:  Chronic pain history, see history for history.   AXIS IV:  Multiple psychosocial stressors, reporting not having been here  very long, limited  support system, medical problems.  History of partial  compliance with treatment.  History of multiple overdoses when feeling that  she is not on the right medications.   AXIS V:  30/55-60.   HOSPITAL COURSE:  Patient was admitted, ordered routine p.r.n. medications,  underwent further monitoring, was encouraged to participate in individual,  group and milieu therapy.  Patient was monitored for withdrawal symptoms.  Vital signs every 4 hours.  Pain management clinic followup was done.  Patient reported needing a followup psychiatrist as well since she had been  discharged from Dr. Milas Hock practice.  Patient was monitored for  safety.  Allergies codeine and Haldol.  Patient seemed somewhat guarded and  unreliable regarding her contracting at times; however, she was able to  contract and was withdrawn, isolating and requesting pain medications,  focused on her physical condition, denied any suicidal or homicidal  ideation, reported that her mood was not that bad, and this was more related  to her pain which required a new pain doctor.  Patient was advised that we  cannot restart the previous medications she had been on, like high dose  morphine, until she had a pain doctor, and the Ultram would be the strongest  medication that we could use.  Keppra was also started for mood instability  and pain and history of seizures.  Patient required p.r.n. due to agitation  and treatment team discussed the patient's lack of progress, disruptive  behavior and med seeking as well as threatening and abusive towards staff  and other patients at times.  However, patient was able to contract for  safety.  She did after 1 day of defying expectations was able to get out of  her bed and go to group and show that she could function safely and that she  could manage and cope with stress and be appropriate on the unit. Therefore, she was discharged with no safety concerns.  Her mood was  euthymic at the  time.  Affect full.  Thought processes goal-directed.  No  dangerous ideation, no confusion, no psychotic symptoms.  Reporting  motivation to be compliant with the aftercare plan.  Follow up with new  psychiatrist to get pain management doctor to manage her pain and symptoms  more appropriately with being compliant with psychiatric followup as well as  to not abuse substances that would make her mood more unstable and possibly  make her  unsafe.   DISCHARGE MEDICATIONS:  She is given medication education and discharged on:  1.  Neurontin 400 mg 2 caps four times a day.  2.  Effexor XR 150 mg 2 q.h.s.  3.  Remeron 30 mg q.h.s.  4.  Ultram 50 mg 2 t.i.d.  5.  Keppra 500 mg 1 at 9:00 a.m. and 1 at 6:00 p.m.   FOLLOW UP:  Patient is to follow up with the contact at [facility], which they require the patient to individually contact  and schedule appointments.  Also the [facility],  given numbers and information and location information.  Patient also is to  follow up with [facility] with Dr. Jacqulyn Bath on February 16 at  1:00 p.m.   DISCHARGE DIAGNOSES:   AXIS I:  1.  Rule out benzodiazepine and opiate abuse, patient dependent as well as      amphetamine dependence.  2.  Rule out substance induced mood disorder.  3.  Possible bipolar II versus major depression, recurrent, moderate,      clearly not a reliable historian, somewhat volitionally manipulating      details.  Could rule out malingering, however, patient does appear to      have true pathology in addition to her personality issues.   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   AXIS III:  Chronic pain history, see history for history.   AXIS IV:  Multiple psychosocial stressors, reporting not having been here  very long, limited support system, medical problems.  History of partial  compliance with treatment.  History of multiple  overdoses when feeling that  she is not on the right medications.   AXIS V:  Global assessment of function on discharge 55.   PROGNOSIS:  Patient has significant Axis II pathology, may continue to be  disruptive and not cooperative while inpatient and patient crisis  stabilization stays probably need to be as short as possible since this is  the kind of patient that may get worse inside the hospital and more  intensive outpatient services will probably required including frequent  therapy and working on DBT and other cognitive behavioral techniques when  more stable.      JEM/MEDQ  D:  09/14/2004  T:  09/15/2004  Job:  [id number]

## 2010-11-29 NOTE — H&P (Signed)
NAME:  Cynthia [name], Cynthia [name] NO.:  [id number]   MEDICAL RECORD NO.:  [id number]          PATIENT TYPE:  IPS   LOCATION:  0301                          [facility]ILITY:  [facility]   PHYSICIAN:  Jeanice Lim, M.D. DATE OF BIRTH:  [birth year]   DATE OF ADMISSION:  08/14/2004  DATE OF DISCHARGE:                         PSYCHIATRIC ADMISSION ASSESSMENT   A 55 year old divorced white female voluntarily admitted 08/14/04.   HISTORY OF PRESENT ILLNESS:  The patient presents with a history of  intentional overdose.  She states that her nerves are bad.  Patient states  she was trying to calm down her nerves and took more Neurontin stating she  did not know how many that she had taken.  She had called her psychiatrist  who, therefore, had called 911.  Patient denies it was suicide attempt.  She  also reports that she has been hearing auditory hallucinations telling her  that she has patient long enough while she had been.  She reports that she  does not want to lose control.  She reports that she has a history of being  very violent when she gets upset and loses control.  She states that the  remainder of the overdose was very inconsequential.  She states that now  she is in-between pain management people for control of her chronic back  pain.   PAST PSYCHIATRIC HISTORY:  First admission to [facility].  She  sees Dr. Tiajuana Amass, outpatient psychiatrist.  She has a history of  cutting in the past.   SOCIAL HISTORY:  This is a 55 year old divorced white female with no  children, who lives alone on disability.  No criminal charges.   FAMILY HISTORY:  Unclear.   ALCOHOL OR DRUG HISTORY:  Patient smokes, denies any alcohol or drug use.   PRIMARY CARE Bryant Saye:  None at this time.  Patient reports she is in-  between pain management people.Marland Kitchen   MEDICAL PROBLEMS:  Chronic back pain.  History of seizures, last seizure 6  months ago.  She is not sure why she has had  seizures, no apparent workup.   MEDICATIONS:  1.  Neurontin 800 mg q.i.d.  2.  Xanax 1 mg q.i.d.  3.  Avinza, which she has not been taking currently.  4.  Nexium 40 mg daily.  5.  MSIR (morphine sulfate intermediate release) 30 mg b.i.d.  6.  Adderall XL 20 mg t.i.d.  7.  Effexor 300 mg daily.   DRUG ALLERGIES:  ZIRCONIUM and HALDOL.   ASSESSMENT:  Patient was assessed at [facility].  This  is a disheveled middle-aged female in no acute distress.  Very anxious.  Head is down at this point in time.  Temperature 98.8, pulse 85,  respirations 18, blood pressure 116/75, weight 172 pounds.  She is 5 feet 7  inches tall.  Her pulse ox is 97%.  She has a normal EKG.  Urine drug screen  is positive for benzos, positive for amphetamines.  Patient did receive 20  mg of Geodon while she was in the emergency department.  Poison Control was  notified  about patient's overdose.   MENTAL STATUS EXAM:  Middle-aged female.  No eye contact, her head is down,  and she is holding her leg.  She is currently in hospital scrubs.  She  appears somewhat disheveled.  Speech is clear.  Mood, the patient feels very  agitated endorsing auditory hallucinations.  Affect is irritable and  demanding.  Cognitive function intact.  Memory, patient is poor and  questionable historian.   DIAGNOSES:   AXIS I:  1.  Rule out benzo and opiate abuse.   AXIS II:  Deferred.   AXIS III:  1.  Chronic pain.  2.  History seizure per history.   AXIS IV:  Other psychosocial problems and medical problems.   AXIS V:  Current is 30, past year 67.   PLAN:  Detox the patient, stabilize mood and thinking.  We will have  Seroquel available for anxiety and psychotic symptoms.  We will encourage  fluids.  We will check the patient's withdrawal symptoms and vital signs  every 4 hours.  Patient may need pain management clinic followup while she  is here.  She will need follow-up psychiatrist as well.   Tentative length of  stay 4-6 days.      JO/MEDQ  D:  08/16/2004  T:  08/16/2004  Job:  [id number]

## 2011-01-16 ENCOUNTER — Other Ambulatory Visit: Payer: Self-pay | Admitting: *Deleted

## 2011-01-16 MED ORDER — RANITIDINE HCL 150 MG PO TABS: 150.0000 mg | ORAL_TABLET | Freq: Two times a day (BID) | ORAL | Status: DC

## 2011-04-04 ENCOUNTER — Telehealth: Payer: Self-pay | Admitting: *Deleted

## 2011-04-04 NOTE — Telephone Encounter (Signed)
All of this was discussed with pt. Earlier.

## 2011-04-04 NOTE — Telephone Encounter (Signed)
Maintain hydration. Tylenol for fever

## 2011-04-04 NOTE — Telephone Encounter (Signed)
Pt is calling stating she is running a fever of about 100, and nausea and vomiting x 2 days.  Strongly encouraged pt to come in today and see someone, but she states she feels too bad to come over.  Warned her about dehydration, dark stools, or blood when she is vomiting, SOB, or fevers over 101.  Gave her the [facility] number in case she changes her mind by tomorrow.  Discussed diet and hydration.

## 2011-04-07 ENCOUNTER — Telehealth: Payer: Self-pay | Admitting: *Deleted

## 2011-04-07 NOTE — Telephone Encounter (Signed)
Pt is asking for an antibiotic for sinus infection....nasal congestion, eye pain and headache.

## 2011-04-07 NOTE — Telephone Encounter (Signed)
Pt will call back for appt when she can arrange it.

## 2011-04-07 NOTE — Telephone Encounter (Signed)
Office visit with any physician

## 2011-07-23 ENCOUNTER — Telehealth: Payer: Self-pay | Admitting: Family Medicine

## 2011-07-23 NOTE — Telephone Encounter (Signed)
Schedule with another provider

## 2011-07-23 NOTE — Telephone Encounter (Signed)
Cynthia [name],  Cynthia [name] states she would like to see Dr. Cato Mulligan because she has emphysema and now a chest cold. She would rather not see anyone else. Is there any time on Thurs or Fri you think I could get her in? Thanks!

## 2011-07-25 NOTE — Telephone Encounter (Signed)
Left message with pt to call back for appt with one of the MDs per Dr. Cato Mulligan.

## 2011-07-28 ENCOUNTER — Encounter: Payer: Self-pay | Admitting: Internal Medicine

## 2011-07-28 ENCOUNTER — Ambulatory Visit (INDEPENDENT_AMBULATORY_CARE_PROVIDER_SITE_OTHER): Payer: Medicare Other | Admitting: Internal Medicine

## 2011-07-28 DIAGNOSIS — J449 Chronic obstructive pulmonary disease, unspecified: Secondary | ICD-10-CM

## 2011-07-28 DIAGNOSIS — Z23 Encounter for immunization: Secondary | ICD-10-CM

## 2011-07-28 DIAGNOSIS — R32 Unspecified urinary incontinence: Secondary | ICD-10-CM | POA: Diagnosis not present

## 2011-07-28 LAB — POCT URINALYSIS DIPSTICK
Spec Grav, UA: >=1.03
Urobilinogen, UA: 1
pH, UA: 5

## 2011-07-28 MED ORDER — PREDNISONE 20 MG PO TABS: ORAL_TABLET | ORAL | Status: DC

## 2011-07-28 MED ORDER — MOXIFLOXACIN HCL 400 MG PO TABS: 400.0000 mg | ORAL_TABLET | Freq: Every day | ORAL | Status: AC

## 2011-07-28 MED ORDER — FLUTICASONE-SALMETEROL 100-50 MCG/DOSE IN AEPB: 1.0000 | INHALATION_SPRAY | Freq: Two times a day (BID) | RESPIRATORY_TRACT | Status: DC

## 2011-07-28 NOTE — Assessment & Plan Note (Signed)
Acute exacerbation Needs ABX and steroids Continue inhalers and stop smoking (she voices understanding

## 2011-07-28 NOTE — Telephone Encounter (Signed)
Appt with Dr. Cato Mulligan 07/28/2011 on schedule.

## 2011-07-28 NOTE — Progress Notes (Signed)
Patient ID: LEZLI DANEK, female   DOB: Jun 02, 1956, 56 y.o.   MRN: [id number] [facility] sxs: Smoker for years: > 100 pack year hx. 7 days of cough, not productive, no significant SOB. Continues to use inhalers. No fever. No known ill contacts. She has known COPD.  Urinary incontinence for several weeks. Worse with uri sxs  Past Medical History  Diagnosis Date  . GERD (gastroesophageal reflux disease)   . Depression   . Bipolar disorder   . COPD (chronic obstructive pulmonary disease)     History   Social History  . Marital Status: Divorced    Spouse Name: N/A    Number of Children: N/A  . Years of Education: N/A   Occupational History  . Not on file.   Social History Main Topics  . Smoking status: Current Everyday Smoker    Types: Cigarettes  . Smokeless tobacco: Not on file  . Alcohol Use: Not on file  . Drug Use: Not on file  . Sexually Active: Not on file   Other Topics Concern  . Not on file   Social History Narrative  . No narrative on file    Past Surgical History  Procedure Date  . Appendectomy   . Cholecystectomy   . Lumbar laminectomy     6 surgeries    Family History  Problem Relation Age of Onset  . Alzheimer's disease Mother   . Colon cancer Father     Allergies  Allergen Reactions  . Codeine Phosphate     Current Outpatient Prescriptions on File Prior to Visit  Medication Sig Dispense Refill  . ADVAIR DISKUS 100-50 MCG/DOSE AEPB INHALE 1 PUFF 2 TIMES DAILY.  1 each  3  . amphetamine-dextroamphetamine (ADDERALL) 20 MG tablet Take 20 mg by mouth 3 (three) times daily.       . ARIPiprazole (ABILIFY) 15 MG tablet Take 15 mg by mouth daily.        . ranitidine (ZANTAC) 150 MG tablet Take 1 tablet (150 mg total) by mouth 2 (two) times daily.  60 tablet  4  . tiotropium (SPIRIVA) 18 MCG inhalation capsule Place 18 mcg into inhaler and inhale daily.        Marland Kitchen venlafaxine (EFFEXOR) 75 MG tablet Take 125 mg by mouth daily.          patient denies  chest pain, shortness of breath, orthopnea. Denies lower extremity edema, abdominal pain, change in appetite, change in bowel movements. Patient denies rashes, musculoskeletal complaints. No other specific complaints in a complete review of systems.   BP 124/74  Pulse 110  Temp(Src) 98.4 F (36.9 C) (Oral)  Wt 191 lb (86.637 kg)  SpO2 98%  Well-developed well-nourished female in no acute distress. HEENT exam atraumatic, normocephalic, extraocular muscles are intact. Neck is supple. No jugular venous distention no thyromegaly. Chest clear to auscultation without increased work of breathing. Cardiac exam S1 and S2 are regular. Abdominal exam active bowel sounds, soft, nontender. Extremities no edema. Neurologic exam she is alert without any motor sensory deficits. Gait is normal.

## 2011-08-07 ENCOUNTER — Encounter: Payer: Self-pay | Admitting: Family Medicine

## 2011-08-07 ENCOUNTER — Ambulatory Visit (INDEPENDENT_AMBULATORY_CARE_PROVIDER_SITE_OTHER): Payer: Medicare Other | Admitting: Family Medicine

## 2011-08-07 VITALS — BP 112/80 | Temp 98.6°F | Wt 195.0 lb

## 2011-08-07 DIAGNOSIS — R3915 Urgency of urination: Secondary | ICD-10-CM | POA: Diagnosis not present

## 2011-08-07 MED ORDER — FESOTERODINE FUMARATE ER 4 MG PO TB24: 4.0000 mg | ORAL_TABLET | Freq: Every day | ORAL | Status: DC

## 2011-08-07 NOTE — Progress Notes (Signed)
  Subjective:    Patient ID: Cynthia [name], female    DOB: [birth year], 56 y.o.   MRN: [id number]  HPI  Patient seen with continued urinary urgency. Present for several weeks but especially over the past couple of weeks. Recent COPD exacerbation. Treated with prednisone and Avelox. Her respiratory symptoms improved. Nocturia usually 2 times per night. No burning with urination. No stress incontinence. Drinks about 16 ounces of coffee per day. No diuretic use. No vaginal discharge. No recent dietary changes. No history of diabetes. No thirst or weight changes.   Review of Systems  Constitutional: Negative for fever, chills and unexpected weight change.  Gastrointestinal: Negative for abdominal pain.  Genitourinary: Positive for urgency and frequency. Negative for hematuria, decreased urine volume and difficulty urinating.       Objective:   Physical Exam  Constitutional: She appears well-developed and well-nourished.  Cardiovascular: Normal rate and regular rhythm.   Pulmonary/Chest: Effort normal and breath sounds normal. No respiratory distress. She has no wheezes. She has no rales.  Abdominal: Soft. There is no tenderness.          Assessment & Plan:  Urinary urgency. First, reduce caffeine consumption gradually. If not adequate improvement with that add Toviaz 4 mg at night with samples provided and review of possible side effects.

## 2011-08-07 NOTE — Patient Instructions (Signed)
Try to gradually decrease caffeine use

## 2011-08-23 ENCOUNTER — Other Ambulatory Visit: Payer: Self-pay | Admitting: Internal Medicine

## 2011-08-25 ENCOUNTER — Telehealth: Payer: Self-pay | Admitting: Internal Medicine

## 2011-08-25 NOTE — Telephone Encounter (Signed)
ok 

## 2011-08-25 NOTE — Telephone Encounter (Signed)
Pt has lab order from dr Archer Asa can I sch?

## 2011-08-25 NOTE — Telephone Encounter (Signed)
Pt is sch for 09-01-2011

## 2011-08-25 NOTE — Telephone Encounter (Signed)
Cynthia [name] I think he routed this by mistake.

## 2011-09-01 ENCOUNTER — Other Ambulatory Visit: Payer: Medicare Other

## 2011-09-09 DIAGNOSIS — F332 Major depressive disorder, recurrent severe without psychotic features: Secondary | ICD-10-CM | POA: Diagnosis not present

## 2011-10-15 DIAGNOSIS — G894 Chronic pain syndrome: Secondary | ICD-10-CM | POA: Diagnosis not present

## 2011-10-15 DIAGNOSIS — M545 Low back pain, unspecified: Secondary | ICD-10-CM | POA: Diagnosis not present

## 2011-10-15 DIAGNOSIS — Z462 Encounter for fitting and adjustment of other devices related to nervous system and special senses: Secondary | ICD-10-CM | POA: Diagnosis not present

## 2011-11-03 ENCOUNTER — Telehealth: Payer: Self-pay | Admitting: Internal Medicine

## 2011-11-03 MED ORDER — FESOTERODINE FUMARATE ER 4 MG PO TB24: 4.0000 mg | ORAL_TABLET | Freq: Every day | ORAL | Status: DC

## 2011-11-03 NOTE — Telephone Encounter (Signed)
Pt called and has question re: fesoterodine (TOVIAZ) 4 MG TB24. Pt said that this med is working well for incontinence, but pt is only urination once or twice per day. Is this ok?

## 2011-11-03 NOTE — Telephone Encounter (Signed)
rx sent in electronically 

## 2011-11-24 DIAGNOSIS — M961 Postlaminectomy syndrome, not elsewhere classified: Secondary | ICD-10-CM | POA: Diagnosis not present

## 2011-11-24 DIAGNOSIS — Z462 Encounter for fitting and adjustment of other devices related to nervous system and special senses: Secondary | ICD-10-CM | POA: Diagnosis not present

## 2011-11-24 DIAGNOSIS — Z791 Long term (current) use of non-steroidal anti-inflammatories (NSAID): Secondary | ICD-10-CM | POA: Diagnosis not present

## 2011-11-24 DIAGNOSIS — Z79899 Other long term (current) drug therapy: Secondary | ICD-10-CM | POA: Diagnosis not present

## 2011-11-24 DIAGNOSIS — F17200 Nicotine dependence, unspecified, uncomplicated: Secondary | ICD-10-CM | POA: Diagnosis not present

## 2011-11-24 DIAGNOSIS — J438 Other emphysema: Secondary | ICD-10-CM | POA: Diagnosis not present

## 2011-11-24 DIAGNOSIS — M545 Low back pain, unspecified: Secondary | ICD-10-CM | POA: Diagnosis not present

## 2011-11-24 DIAGNOSIS — Z885 Allergy status to narcotic agent status: Secondary | ICD-10-CM | POA: Diagnosis not present

## 2011-11-24 DIAGNOSIS — T85695A Other mechanical complication of other nervous system device, implant or graft, initial encounter: Secondary | ICD-10-CM | POA: Diagnosis not present

## 2011-12-01 DIAGNOSIS — G894 Chronic pain syndrome: Secondary | ICD-10-CM | POA: Diagnosis not present

## 2011-12-01 DIAGNOSIS — Z79899 Other long term (current) drug therapy: Secondary | ICD-10-CM | POA: Diagnosis not present

## 2011-12-01 DIAGNOSIS — T85695A Other mechanical complication of other nervous system device, implant or graft, initial encounter: Secondary | ICD-10-CM | POA: Diagnosis not present

## 2012-01-24 ENCOUNTER — Other Ambulatory Visit: Payer: Self-pay | Admitting: Internal Medicine

## 2012-02-06 DIAGNOSIS — F332 Major depressive disorder, recurrent severe without psychotic features: Secondary | ICD-10-CM | POA: Diagnosis not present

## 2012-02-18 ENCOUNTER — Other Ambulatory Visit: Payer: Self-pay | Admitting: Internal Medicine

## 2012-02-27 DIAGNOSIS — G894 Chronic pain syndrome: Secondary | ICD-10-CM | POA: Diagnosis not present

## 2012-02-27 DIAGNOSIS — M545 Low back pain, unspecified: Secondary | ICD-10-CM | POA: Diagnosis not present

## 2012-02-27 DIAGNOSIS — Z462 Encounter for fitting and adjustment of other devices related to nervous system and special senses: Secondary | ICD-10-CM | POA: Diagnosis not present

## 2012-04-11 ENCOUNTER — Other Ambulatory Visit: Payer: Self-pay | Admitting: Family Medicine

## 2012-06-03 DIAGNOSIS — M545 Low back pain, unspecified: Secondary | ICD-10-CM | POA: Diagnosis not present

## 2012-06-03 DIAGNOSIS — Z462 Encounter for fitting and adjustment of other devices related to nervous system and special senses: Secondary | ICD-10-CM | POA: Diagnosis not present

## 2012-06-07 ENCOUNTER — Other Ambulatory Visit: Payer: Self-pay | Admitting: Internal Medicine

## 2012-07-08 DIAGNOSIS — F332 Major depressive disorder, recurrent severe without psychotic features: Secondary | ICD-10-CM | POA: Diagnosis not present

## 2012-09-20 DIAGNOSIS — M545 Low back pain, unspecified: Secondary | ICD-10-CM | POA: Diagnosis not present

## 2012-09-30 DIAGNOSIS — M542 Cervicalgia: Secondary | ICD-10-CM | POA: Diagnosis not present

## 2012-09-30 DIAGNOSIS — F3289 Other specified depressive episodes: Secondary | ICD-10-CM | POA: Diagnosis not present

## 2012-09-30 DIAGNOSIS — Z79899 Other long term (current) drug therapy: Secondary | ICD-10-CM | POA: Diagnosis not present

## 2012-09-30 DIAGNOSIS — M47812 Spondylosis without myelopathy or radiculopathy, cervical region: Secondary | ICD-10-CM | POA: Diagnosis not present

## 2012-09-30 DIAGNOSIS — F17200 Nicotine dependence, unspecified, uncomplicated: Secondary | ICD-10-CM | POA: Diagnosis not present

## 2012-09-30 DIAGNOSIS — Z885 Allergy status to narcotic agent status: Secondary | ICD-10-CM | POA: Diagnosis not present

## 2012-09-30 DIAGNOSIS — F329 Major depressive disorder, single episode, unspecified: Secondary | ICD-10-CM | POA: Diagnosis not present

## 2012-09-30 DIAGNOSIS — S139XXA Sprain of joints and ligaments of unspecified parts of neck, initial encounter: Secondary | ICD-10-CM | POA: Diagnosis not present

## 2012-10-19 ENCOUNTER — Other Ambulatory Visit: Payer: Self-pay | Admitting: Internal Medicine

## 2012-10-25 DIAGNOSIS — F332 Major depressive disorder, recurrent severe without psychotic features: Secondary | ICD-10-CM | POA: Diagnosis not present

## 2012-11-13 ENCOUNTER — Other Ambulatory Visit: Payer: Self-pay | Admitting: Internal Medicine

## 2012-11-16 ENCOUNTER — Other Ambulatory Visit: Payer: Self-pay | Admitting: Internal Medicine

## 2012-11-23 ENCOUNTER — Other Ambulatory Visit: Payer: Self-pay | Admitting: Internal Medicine

## 2012-12-01 ENCOUNTER — Telehealth: Payer: Self-pay | Admitting: Internal Medicine

## 2012-12-01 NOTE — Telephone Encounter (Signed)
PT called to to request a refill of the following medications, TOVIAZ 4 MG TB24, and ranitidine (ZANTAC) 150 MG tablet. She would like them called into the [facility] on [address]. She is in the process of transferring care to Dr. Caryl Never, but will not see him for the first time until 12/22/12. Please assist.

## 2012-12-02 MED ORDER — FESOTERODINE FUMARATE ER 4 MG PO TB24: ORAL_TABLET | ORAL | Status: DC

## 2012-12-02 NOTE — Telephone Encounter (Signed)
rx sent in electronically 

## 2012-12-10 ENCOUNTER — Other Ambulatory Visit: Payer: Self-pay | Admitting: *Deleted

## 2012-12-10 MED ORDER — RANITIDINE HCL 150 MG PO TABS: ORAL_TABLET | ORAL | Status: DC

## 2012-12-13 DIAGNOSIS — M545 Low back pain, unspecified: Secondary | ICD-10-CM | POA: Diagnosis not present

## 2012-12-22 ENCOUNTER — Ambulatory Visit (INDEPENDENT_AMBULATORY_CARE_PROVIDER_SITE_OTHER): Payer: Medicare Other | Admitting: Family Medicine

## 2012-12-22 VITALS — BP 120/72 | Temp 98.4°F | Wt 188.0 lb

## 2012-12-22 DIAGNOSIS — N3941 Urge incontinence: Secondary | ICD-10-CM | POA: Diagnosis not present

## 2012-12-22 DIAGNOSIS — J449 Chronic obstructive pulmonary disease, unspecified: Secondary | ICD-10-CM

## 2012-12-22 DIAGNOSIS — K219 Gastro-esophageal reflux disease without esophagitis: Secondary | ICD-10-CM | POA: Diagnosis not present

## 2012-12-22 DIAGNOSIS — B351 Tinea unguium: Secondary | ICD-10-CM

## 2012-12-22 LAB — HEPATIC FUNCTION PANEL
ALT: 17 U/L (ref 0–35)
AST: 20 U/L (ref 0–37)
Albumin: 3.9 g/dL (ref 3.5–5.2)

## 2012-12-22 MED ORDER — TERBINAFINE HCL 250 MG PO TABS: 250.0000 mg | ORAL_TABLET | Freq: Every day | ORAL | Status: DC

## 2012-12-22 NOTE — Progress Notes (Signed)
Quick Note:  Pt informed ______ 

## 2012-12-22 NOTE — Progress Notes (Signed)
  Subjective:    Patient ID: Cynthia [name], female    DOB: October 15, 1955, 57 y.o.   MRN: [id number]  HPI Patient is seen to reestablish care with new primary provider Past medical history reviewed. Ongoing nicotine use, history of COPD, GERD, bipolar disorder, urinary urgency. She is followed closely by psychiatry and bipolar is stable  She smokes one pack cigarettes per day. Low motivation to quit. Quit taking her inhalers several months ago. Has had some increased wheezing and shortness of breath since then. No documented Pneumovax. Usually gets yearly flu vaccines  GERD symptoms stable with Zantac twice daily. No recent dysphagia urinary symptoms greatly improved with Toviaz 4 mg daily  Thick and somewhat brittle great toenails bilaterally. Requesting treatment. No history of diabetes. Toenails are occasionally painful.  Past Medical History  Diagnosis Date  . GERD (gastroesophageal reflux disease)   . Depression   . Bipolar disorder   . COPD (chronic obstructive pulmonary disease)    Past Surgical History  Procedure Laterality Date  . Appendectomy    . Cholecystectomy    . Lumbar laminectomy      6 surgeries    reports that she has been smoking Cigarettes.  She has been smoking about 0.00 packs per day. She does not have any smokeless tobacco history on file. Her alcohol and drug histories are not on file. family history includes Alzheimer's disease in her mother and Colon cancer in her father. Allergies  Allergen Reactions  . Codeine Phosphate       Review of Systems  Constitutional: Negative for fatigue.  Eyes: Negative for visual disturbance.  Respiratory: Positive for shortness of breath (chronic and unchanged) and wheezing. Negative for cough and chest tightness.   Cardiovascular: Negative for chest pain, palpitations and leg swelling.  Genitourinary: Negative for dysuria.  Neurological: Negative for dizziness, seizures, syncope, weakness, light-headedness and  headaches.       Objective:   Physical Exam  Constitutional: She appears well-developed and well-nourished.  Neck: Neck supple. No thyromegaly present.  Cardiovascular: Normal rate and regular rhythm.   Pulmonary/Chest:  Patient has a few scattered wheezes. No rales  Musculoskeletal: She exhibits no edema.  Lymphadenopathy:    She has no cervical adenopathy.  Skin:  Patient has thickened > brittle nails involving great toes bilaterally          Assessment & Plan:  #1 COPD. Patient recently off inhalers. Get back on regular use of her inhalers. Provider for yearly flu vaccine. Pneumovax at followup visit. Discussed smoking cessation current motivation low #2 urinary urgency. Improved with medication above. #3 GERD symptomatically stable with ranitidine 150 mg twice daily #4 probable onychomycosis. Discussed pros and cons of treatment. Check hepatic panel. If normal, Lamisil 250 mg once daily for 3 months. #5 health maintenance. Recommend complete physical. She will schedule

## 2012-12-31 ENCOUNTER — Other Ambulatory Visit: Payer: Self-pay | Admitting: Internal Medicine

## 2013-01-07 ENCOUNTER — Telehealth: Payer: Self-pay | Admitting: Internal Medicine

## 2013-01-07 DIAGNOSIS — J449 Chronic obstructive pulmonary disease, unspecified: Secondary | ICD-10-CM

## 2013-01-07 MED ORDER — FLUTICASONE-SALMETEROL 100-50 MCG/DOSE IN AEPB: 1.0000 | INHALATION_SPRAY | Freq: Two times a day (BID) | RESPIRATORY_TRACT | Status: DC

## 2013-01-07 NOTE — Telephone Encounter (Signed)
PT called to request a new RX for [facility] on [address]. She stated that this was discussed at her last visit, but it was not called in. Please assist.

## 2013-01-07 NOTE — Telephone Encounter (Signed)
rx sent in electronically 

## 2013-01-11 ENCOUNTER — Telehealth: Payer: Self-pay

## 2013-01-11 MED ORDER — RANITIDINE HCL 150 MG PO TABS: ORAL_TABLET | ORAL | Status: DC

## 2013-01-11 NOTE — Telephone Encounter (Signed)
Refill on Ranitidine 150 mg tablet #60 Patient does have a upcoming appointment on 03/24/13

## 2013-01-11 NOTE — Telephone Encounter (Signed)
Refill for 6 months. 

## 2013-01-12 DIAGNOSIS — S59919A Unspecified injury of unspecified forearm, initial encounter: Secondary | ICD-10-CM | POA: Diagnosis not present

## 2013-01-12 DIAGNOSIS — M65849 Other synovitis and tenosynovitis, unspecified hand: Secondary | ICD-10-CM | POA: Diagnosis not present

## 2013-01-12 DIAGNOSIS — S59909A Unspecified injury of unspecified elbow, initial encounter: Secondary | ICD-10-CM | POA: Diagnosis not present

## 2013-01-12 DIAGNOSIS — K219 Gastro-esophageal reflux disease without esophagitis: Secondary | ICD-10-CM | POA: Diagnosis not present

## 2013-01-12 DIAGNOSIS — M65839 Other synovitis and tenosynovitis, unspecified forearm: Secondary | ICD-10-CM | POA: Diagnosis not present

## 2013-01-12 DIAGNOSIS — F329 Major depressive disorder, single episode, unspecified: Secondary | ICD-10-CM | POA: Diagnosis not present

## 2013-01-12 DIAGNOSIS — F17200 Nicotine dependence, unspecified, uncomplicated: Secondary | ICD-10-CM | POA: Diagnosis not present

## 2013-01-12 DIAGNOSIS — Z79899 Other long term (current) drug therapy: Secondary | ICD-10-CM | POA: Diagnosis not present

## 2013-01-12 DIAGNOSIS — F3289 Other specified depressive episodes: Secondary | ICD-10-CM | POA: Diagnosis not present

## 2013-01-12 DIAGNOSIS — Z885 Allergy status to narcotic agent status: Secondary | ICD-10-CM | POA: Diagnosis not present

## 2013-01-25 ENCOUNTER — Other Ambulatory Visit: Payer: Self-pay | Admitting: Internal Medicine

## 2013-02-25 ENCOUNTER — Other Ambulatory Visit: Payer: Self-pay | Admitting: Internal Medicine

## 2013-03-22 DIAGNOSIS — M545 Low back pain, unspecified: Secondary | ICD-10-CM | POA: Diagnosis not present

## 2013-03-24 ENCOUNTER — Ambulatory Visit: Payer: Medicare Other | Admitting: Family Medicine

## 2013-03-30 ENCOUNTER — Ambulatory Visit: Payer: Medicare Other | Admitting: Family Medicine

## 2013-04-20 ENCOUNTER — Other Ambulatory Visit: Payer: Self-pay | Admitting: Family Medicine

## 2013-05-02 DIAGNOSIS — F332 Major depressive disorder, recurrent severe without psychotic features: Secondary | ICD-10-CM | POA: Diagnosis not present

## 2013-06-16 ENCOUNTER — Other Ambulatory Visit: Payer: Self-pay | Admitting: Family Medicine

## 2013-06-22 DIAGNOSIS — G894 Chronic pain syndrome: Secondary | ICD-10-CM | POA: Diagnosis not present

## 2013-06-27 ENCOUNTER — Encounter: Payer: Self-pay | Admitting: Family Medicine

## 2013-06-27 ENCOUNTER — Ambulatory Visit (INDEPENDENT_AMBULATORY_CARE_PROVIDER_SITE_OTHER): Payer: Medicare Other | Admitting: Family Medicine

## 2013-06-27 VITALS — BP 110/66 | HR 107 | Temp 98.2°F | Wt 195.0 lb

## 2013-06-27 DIAGNOSIS — Z23 Encounter for immunization: Secondary | ICD-10-CM | POA: Diagnosis not present

## 2013-06-27 DIAGNOSIS — K219 Gastro-esophageal reflux disease without esophagitis: Secondary | ICD-10-CM

## 2013-06-27 DIAGNOSIS — E669 Obesity, unspecified: Secondary | ICD-10-CM | POA: Insufficient documentation

## 2013-06-27 DIAGNOSIS — J449 Chronic obstructive pulmonary disease, unspecified: Secondary | ICD-10-CM

## 2013-06-27 NOTE — Progress Notes (Signed)
   Subjective:    Patient ID: Cynthia [name], female    DOB: May 18, 1956, 57 y.o.   MRN: [id number]  HPI Patient is here for routine medical followup She has COPD and ongoing nicotine use. Low motivation to quit. She has bipolar disorder followed by psychiatrist. GERD which is controlled with Zantac. No recent dysphagia. Chest recent weight gain.  She needs flu vaccine. She does not take her inhalers consistently. She is supposed to be on Spiriva and Advair. Denies any recent increased cough or shortness of breath above baseline. She's had previous pneumonia vaccine  Past Medical History  Diagnosis Date  . GERD (gastroesophageal reflux disease)   . Depression   . Bipolar disorder   . COPD (chronic obstructive pulmonary disease)    Past Surgical History  Procedure Laterality Date  . Appendectomy    . Cholecystectomy    . Lumbar laminectomy      6 surgeries    reports that she has been smoking Cigarettes.  She has been smoking about 0.00 packs per day. She does not have any smokeless tobacco history on file. Her alcohol and drug histories are not on file. family history includes Alzheimer's disease in her mother; Colon cancer in her father. Allergies  Allergen Reactions  . Codeine Phosphate       Review of Systems  Constitutional: Negative for fatigue.  Eyes: Negative for visual disturbance.  Respiratory: Negative for cough, chest tightness and wheezing.   Cardiovascular: Negative for chest pain, palpitations and leg swelling.  Neurological: Negative for dizziness, seizures, syncope, weakness, light-headedness and headaches.       Objective:   Physical Exam  Constitutional: She appears well-developed and well-nourished.  Neck: Neck supple. No thyromegaly present.  Cardiovascular: Normal rate.   Pulmonary/Chest: Effort normal and breath sounds normal. No respiratory distress. She has no wheezes. She has no rales.  Musculoskeletal: She exhibits no edema.         Assessment & Plan:  COPD. Stable. Discussion with patient regarding smoking cessation. Current motivation low. Flu vaccine given. She is encouraged to take her inhalers regularly. We also strongly encouraged complete physical

## 2013-06-27 NOTE — Progress Notes (Signed)
Pre visit review using our clinic review tool, if applicable. No additional management support is needed unless otherwise documented below in the visit note. 

## 2013-06-27 NOTE — Patient Instructions (Signed)
Smoking Cessation Quitting smoking is important to your health and has many advantages. However, it is not always easy to quit since nicotine is a very addictive drug. Often times, people try 3 times or more before being able to quit. This document explains the best ways for you to prepare to quit smoking. Quitting takes hard work and a lot of effort, but you can do it. ADVANTAGES OF QUITTING SMOKING  You will live longer, feel better, and live better.  Your body will feel the impact of quitting smoking almost immediately.  Within 20 minutes, blood pressure decreases. Your pulse returns to its normal level.  After 8 hours, carbon monoxide levels in the blood return to normal. Your oxygen level increases.  After 24 hours, the chance of having a heart attack starts to decrease. Your breath, hair, and body stop smelling like smoke.  After 48 hours, damaged nerve endings begin to recover. Your sense of taste and smell improve.  After 72 hours, the body is virtually free of nicotine. Your bronchial tubes relax and breathing becomes easier.  After 2 to 12 weeks, lungs can hold more air. Exercise becomes easier and circulation improves.  The risk of having a heart attack, stroke, cancer, or lung disease is greatly reduced.  After 1 year, the risk of coronary heart disease is cut in half.  After 5 years, the risk of stroke falls to the same as a nonsmoker.  After 10 years, the risk of lung cancer is cut in half and the risk of other cancers decreases significantly.  After 15 years, the risk of coronary heart disease drops, usually to the level of a nonsmoker.  If you are pregnant, quitting smoking will improve your chances of having a healthy baby.  The people you live with, especially any children, will be healthier.  You will have extra money to spend on things other than cigarettes. QUESTIONS TO THINK ABOUT BEFORE ATTEMPTING TO QUIT You may want to talk about your answers with your  caregiver.  Why do you want to quit?  If you tried to quit in the past, what helped and what did not?  What will be the most difficult situations for you after you quit? How will you plan to handle them?  Who can help you through the tough times? Your family? Friends? A caregiver?  What pleasures do you get from smoking? What ways can you still get pleasure if you quit? Here are some questions to ask your caregiver:  How can you help me to be successful at quitting?  What medicine do you think would be best for me and how should I take it?  What should I do if I need more help?  What is smoking withdrawal like? How can I get information on withdrawal? GET READY  Set a quit date.  Change your environment by getting rid of all cigarettes, ashtrays, matches, and lighters in your home, car, or work. Do not let people smoke in your home.  Review your past attempts to quit. Think about what worked and what did not. GET SUPPORT AND ENCOURAGEMENT You have a better chance of being successful if you have help. You can get support in many ways.  Tell your family, friends, and co-workers that you are going to quit and need their support. Ask them not to smoke around you.  Get individual, group, or telephone counseling and support. Programs are available at local hospitals and health centers. Call your local health department for   information about programs in your area.  Spiritual beliefs and practices may help some smokers quit.  Download a "quit meter" on your computer to keep track of quit statistics, such as how long you have gone without smoking, cigarettes not smoked, and money saved.  Get a self-help book about quitting smoking and staying off of tobacco. LEARN NEW SKILLS AND BEHAVIORS  Distract yourself from urges to smoke. Talk to someone, go for a walk, or occupy your time with a task.  Change your normal routine. Take a different route to work. Drink tea instead of coffee.  Eat breakfast in a different place.  Reduce your stress. Take a hot bath, exercise, or read a book.  Plan something enjoyable to do every day. Reward yourself for not smoking.  Explore interactive web-based programs that specialize in helping you quit. GET MEDICINE AND USE IT CORRECTLY Medicines can help you stop smoking and decrease the urge to smoke. Combining medicine with the above behavioral methods and support can greatly increase your chances of successfully quitting smoking.  Nicotine replacement therapy helps deliver nicotine to your body without the negative effects and risks of smoking. Nicotine replacement therapy includes nicotine gum, lozenges, inhalers, nasal sprays, and skin patches. Some may be available over-the-counter and others require a prescription.  Antidepressant medicine helps people abstain from smoking, but how this works is unknown. This medicine is available by prescription.  Nicotinic receptor partial agonist medicine simulates the effect of nicotine in your brain. This medicine is available by prescription. Ask your caregiver for advice about which medicines to use and how to use them based on your health history. Your caregiver will tell you what side effects to look out for if you choose to be on a medicine or therapy. Carefully read the information on the package. Do not use any other product containing nicotine while using a nicotine replacement product.  RELAPSE OR DIFFICULT SITUATIONS Most relapses occur within the first 3 months after quitting. Do not be discouraged if you start smoking again. Remember, most people try several times before finally quitting. You may have symptoms of withdrawal because your body is used to nicotine. You may crave cigarettes, be irritable, feel very hungry, cough often, get headaches, or have difficulty concentrating. The withdrawal symptoms are only temporary. They are strongest when you first quit, but they will go away within  10 14 days. To reduce the chances of relapse, try to:  Avoid drinking alcohol. Drinking lowers your chances of successfully quitting.  Reduce the amount of caffeine you consume. Once you quit smoking, the amount of caffeine in your body increases and can give you symptoms, such as a rapid heartbeat, sweating, and anxiety.  Avoid smokers because they can make you want to smoke.  Do not let weight gain distract you. Many smokers will gain weight when they quit, usually less than 10 pounds. Eat a healthy diet and stay active. You can always lose the weight gained after you quit.  Find ways to improve your mood other than smoking. FOR MORE INFORMATION  www.smokefree.gov  Document Released: 06/24/2001 Document Revised: 12/30/2011 Document Reviewed: 10/09/2011 [facility] Patient Information 2014 [place], [place].  Consider complete physical at some point during the next few months.

## 2013-07-23 ENCOUNTER — Other Ambulatory Visit: Payer: Self-pay | Admitting: Family Medicine

## 2013-07-29 ENCOUNTER — Other Ambulatory Visit: Payer: Self-pay | Admitting: Family Medicine

## 2013-08-08 ENCOUNTER — Telehealth: Payer: Self-pay | Admitting: Family Medicine

## 2013-08-08 NOTE — Telephone Encounter (Signed)
Patient Information:  Caller Name: Barkley BrunsKristine  Phone: [phone]  Patient: Cynthia [name], Cynthia [name]  Gender: Female  DOB: 1956-02-27  Age: 58 Years  PCP: Evelena PeatBurchette, [facility](Family Practice)  Office Follow Up:  Does the office need to follow up with this patient?: No  Instructions For The Office: N/A  RN Note:  Wheezing present with frequent moist, non-productive cough. Instructed to use plain Guaifenesin, not combination product.  Hydrate and humidify. [facility] Requested appointment for 08/09/13 inspite of RN recommendation to be seen now due to COPD history and audible wheezing.    Symptoms  Reason For Call & Symptoms: Suspected influenza symtpoms since 08/05/13 with residual chest and head congestion.  History of COPD.  Reviewed Health History In EMR: Yes  Reviewed Medications In EMR: Yes  Reviewed Allergies In EMR: Yes  Reviewed Surgeries / Procedures: Yes  Date of Onset of Symptoms: 08/05/2013  Treatments Tried: Mucinex multi-symptom  Treatments Tried Worked: Yes  Guideline(s) Used:  Influenza - Seasonal  Cough  Disposition Per Guideline:   Go to Office Now  Reason For Disposition Reached:   Wheezing is present  Advice Given:  Reassurance  Coughing is the way that our lungs remove irritants and mucus. It helps protect our lungs from getting pneumonia.  Cough Medicines:  Home Remedy - Hard Candy: Hard candy works just as well as medicine-flavored OTC cough drops. Diabetics should use sugar-free candy.  Home Remedy - Honey: This old home remedy has been shown to help decrease coughing at night. The adult dosage is 2 teaspoons (10 ml) at bedtime. Honey should not be given to infants under one year of age.  Coughing Spasms:  Drink warm fluids. Inhale warm mist (Reason: both relax the airway and loosen up the phlegm).  Prevent Dehydration:  Drink adequate liquids.  This will help soothe an irritated or dry throat and loosen up the phlegm.  Expected Course:   The expected course depends on what is causing the cough.  Viral bronchitis (chest cold) causes a cough that lasts 1 to 3 weeks. Sometimes you may cough up lots of phlegm (sputum, mucus). The mucus can normally be white, gray, yellow, or green.  Call Back If:  Difficulty breathing  Cough lasts more than 3 weeks  You become worse.  Patient Will Follow Care Advice: No; requested appt for 08/09/13  Appointment Scheduled:  08/09/2013 09:45:00 Appointment Scheduled Provider:  Evelena PeatBurchette, [facility](Family Practice)

## 2013-08-09 ENCOUNTER — Ambulatory Visit (INDEPENDENT_AMBULATORY_CARE_PROVIDER_SITE_OTHER): Payer: Medicare Other | Admitting: Family Medicine

## 2013-08-09 ENCOUNTER — Encounter: Payer: Self-pay | Admitting: Family Medicine

## 2013-08-09 VITALS — BP 124/74 | HR 107 | Temp 98.8°F | Wt 200.0 lb

## 2013-08-09 DIAGNOSIS — J441 Chronic obstructive pulmonary disease with (acute) exacerbation: Secondary | ICD-10-CM | POA: Diagnosis not present

## 2013-08-09 MED ORDER — ALBUTEROL SULFATE HFA 108 (90 BASE) MCG/ACT IN AERS: 2.0000 | INHALATION_SPRAY | RESPIRATORY_TRACT | Status: DC | PRN

## 2013-08-09 MED ORDER — PREDNISONE 10 MG PO TABS: ORAL_TABLET | ORAL | Status: DC

## 2013-08-09 MED ORDER — DOXYCYCLINE HYCLATE 100 MG PO CAPS: 100.0000 mg | ORAL_CAPSULE | Freq: Two times a day (BID) | ORAL | Status: DC

## 2013-08-09 NOTE — Progress Notes (Signed)
   Subjective:    Patient ID: Cynthia MinionKristine L [name], female    DOB: 1956/02/07, 58 y.o.   MRN: [id number]  HPI History of COPD seen with approximately 3 to four-day history of cough and rhinorrhea. She's had some increased dyspnea over baseline. No fever. Cough has been mostly nonproductive. She takes Spiriva though inconsistently. She does not have albuterol. She is wheezing some off and on. Cough has been moderate. She has no dyspnea at rest but only with activity. No chest pains.  Past Medical History  Diagnosis Date  . GERD (gastroesophageal reflux disease)   . Depression   . Bipolar disorder   . COPD (chronic obstructive pulmonary disease)    Past Surgical History  Procedure Laterality Date  . Appendectomy    . Cholecystectomy    . Lumbar laminectomy      6 surgeries    reports that she has been smoking Cigarettes.  She has been smoking about 0.00 packs per day. She does not have any smokeless tobacco history on file. Her alcohol and drug histories are not on file. family history includes Alzheimer's disease in her mother; Colon cancer in her father. Allergies  Allergen Reactions  . Codeine Phosphate       Review of Systems  Constitutional: Negative for fever and chills.  HENT: Negative for congestion and sore throat.   Respiratory: Positive for cough, shortness of breath and wheezing.   Cardiovascular: Negative for chest pain.       Objective:   Physical Exam  Constitutional: She appears well-developed and well-nourished.  HENT:  Mouth/Throat: Oropharynx is clear and moist.  Neck: Neck supple.  Cardiovascular: Normal rate and regular rhythm.   Pulmonary/Chest: Effort normal.  Lymphadenopathy:    She has no cervical adenopathy.          Assessment & Plan:  Acute exacerbation of COPD. Suspect viral. Prednisone taper. Albuterol for as needed use. Recommend regular use of Spiriva. Start doxycycline if she develops any fever, increased shortness of breath or  increased color changes to mucus

## 2013-08-09 NOTE — Progress Notes (Signed)
Pre visit review using our clinic review tool, if applicable. No additional management support is needed unless otherwise documented below in the visit note. 

## 2013-08-09 NOTE — Patient Instructions (Signed)
Start antibiotic for any fever or increasing sputum production or color changes

## 2013-08-10 ENCOUNTER — Telehealth: Payer: Self-pay | Admitting: Family Medicine

## 2013-08-10 NOTE — Telephone Encounter (Signed)
Pt informed. And pt stated the urine frequency is less now.

## 2013-08-10 NOTE — Telephone Encounter (Signed)
Patient Information:  Caller Name: Barkley BrunsKristine  Phone: [phone]  Patient: Cynthia [name], Cynthia [name]  Gender: Female  DOB: 06-Mar-1956  Age: 58 Years  PCP: Evelena PeatBurchette, [facility] Practice)  Office Follow Up:  Does the office need to follow up with this patient?: Yes  Instructions For The Office: Please review then contact patient with instructions.  RN Note:  Spoke with Samara DeistKathryn in office - will discuss with Dr Caryl NeverBurchette and call patient back at  [phone].  Symptoms  Reason For Call & Symptoms: Seen in office yesterday 1/27, started new medicines at noon:  Albuterol, Doxycycline, Prednisone.  Took night meds then started nausea, vomiting, stomach burning about 10 pm last night.  Has vomited about 6 times, last time was 30 minutes ago.  Has felt achy but afebrile.  Urinating frequently  Reviewed Health History In EMR: Yes  Reviewed Medications In EMR: Yes  Reviewed Allergies In EMR: Yes  Reviewed Surgeries / Procedures: Yes  Date of Onset of Symptoms: 08/09/2013  Treatments Tried: Took Zantac 150mg  tabs  3 in one hour relieved some of stomach buring  Treatments Tried Worked: Yes  Guideline(s) Used:  Vomiting  Disposition Per Guideline:   Go to ED Now  Reason For Disposition Reached:   Severe vomiting (e.g., 6 or more times/day)  Advice Given:  N/A  Patient Will Follow Care Advice:  YES

## 2013-08-10 NOTE — Telephone Encounter (Signed)
STOP Doxycycline. Would not start back any antibiotic at this time unless she is developing any fever.  Make sure she is taking prednisone with food. If she is having significant urine frequency we should try to get her blood sugar checked-   Does she have access to anyone with machine?

## 2013-08-10 NOTE — Telephone Encounter (Signed)
Relevant patient education assigned to patient using Emmi. ° °

## 2013-08-16 ENCOUNTER — Telehealth: Payer: Self-pay | Admitting: Family Medicine

## 2013-08-16 NOTE — Telephone Encounter (Signed)
Relevant patient education assigned to patient using Emmi. ° °

## 2013-09-13 ENCOUNTER — Other Ambulatory Visit: Payer: Self-pay | Admitting: Family Medicine

## 2013-09-22 DIAGNOSIS — IMO0002 Reserved for concepts with insufficient information to code with codable children: Secondary | ICD-10-CM | POA: Diagnosis not present

## 2013-09-22 DIAGNOSIS — M542 Cervicalgia: Secondary | ICD-10-CM | POA: Diagnosis not present

## 2013-09-22 DIAGNOSIS — M545 Low back pain, unspecified: Secondary | ICD-10-CM | POA: Diagnosis not present

## 2013-09-22 DIAGNOSIS — G894 Chronic pain syndrome: Secondary | ICD-10-CM | POA: Diagnosis not present

## 2013-12-22 DIAGNOSIS — M545 Low back pain, unspecified: Secondary | ICD-10-CM | POA: Diagnosis not present

## 2013-12-22 DIAGNOSIS — G894 Chronic pain syndrome: Secondary | ICD-10-CM | POA: Diagnosis not present

## 2013-12-22 DIAGNOSIS — IMO0002 Reserved for concepts with insufficient information to code with codable children: Secondary | ICD-10-CM | POA: Diagnosis not present

## 2013-12-30 ENCOUNTER — Other Ambulatory Visit: Payer: Self-pay | Admitting: Family Medicine

## 2014-01-30 ENCOUNTER — Other Ambulatory Visit: Payer: Self-pay | Admitting: Internal Medicine

## 2014-03-03 DIAGNOSIS — F332 Major depressive disorder, recurrent severe without psychotic features: Secondary | ICD-10-CM | POA: Diagnosis not present

## 2014-03-28 DIAGNOSIS — M545 Low back pain, unspecified: Secondary | ICD-10-CM | POA: Diagnosis not present

## 2014-03-28 DIAGNOSIS — G894 Chronic pain syndrome: Secondary | ICD-10-CM | POA: Diagnosis not present

## 2014-03-28 DIAGNOSIS — IMO0002 Reserved for concepts with insufficient information to code with codable children: Secondary | ICD-10-CM | POA: Diagnosis not present

## 2014-03-30 ENCOUNTER — Other Ambulatory Visit: Payer: Self-pay | Admitting: Family Medicine

## 2014-04-07 ENCOUNTER — Other Ambulatory Visit: Payer: Self-pay | Admitting: Family Medicine

## 2014-05-15 ENCOUNTER — Ambulatory Visit: Payer: Medicare Other | Admitting: Family Medicine

## 2014-05-17 ENCOUNTER — Ambulatory Visit (INDEPENDENT_AMBULATORY_CARE_PROVIDER_SITE_OTHER): Payer: Medicare Other | Admitting: Family Medicine

## 2014-05-17 ENCOUNTER — Ambulatory Visit (INDEPENDENT_AMBULATORY_CARE_PROVIDER_SITE_OTHER): Payer: Medicare Other

## 2014-05-17 ENCOUNTER — Encounter: Payer: Self-pay | Admitting: Family Medicine

## 2014-05-17 VITALS — BP 128/80 | HR 102 | Temp 98.6°F | Wt 212.0 lb

## 2014-05-17 DIAGNOSIS — Z79899 Other long term (current) drug therapy: Secondary | ICD-10-CM

## 2014-05-17 DIAGNOSIS — Z23 Encounter for immunization: Secondary | ICD-10-CM

## 2014-05-17 DIAGNOSIS — R319 Hematuria, unspecified: Secondary | ICD-10-CM

## 2014-05-17 DIAGNOSIS — S93401A Sprain of unspecified ligament of right ankle, initial encounter: Secondary | ICD-10-CM

## 2014-05-17 DIAGNOSIS — B351 Tinea unguium: Secondary | ICD-10-CM

## 2014-05-17 LAB — URINALYSIS, MICROSCOPIC ONLY

## 2014-05-17 LAB — HEPATIC FUNCTION PANEL
ALBUMIN: 3.3 g/dL — AB (ref 3.5–5.2)
ALK PHOS: 96 U/L (ref 39–117)
ALT: 15 U/L (ref 0–35)
AST: 20 U/L (ref 0–37)
BILIRUBIN DIRECT: 0 mg/dL (ref 0.0–0.3)
BILIRUBIN TOTAL: 0.2 mg/dL (ref 0.2–1.2)
TOTAL PROTEIN: 7.4 g/dL (ref 6.0–8.3)

## 2014-05-17 LAB — POCT URINALYSIS DIPSTICK
BILIRUBIN UA: NEGATIVE
GLUCOSE UA: NEGATIVE
Nitrite, UA: NEGATIVE
SPEC GRAV UA: 1.025
Urobilinogen, UA: 0.2
pH, UA: 6

## 2014-05-17 MED ORDER — TERBINAFINE HCL 250 MG PO TABS
250.0000 mg | ORAL_TABLET | Freq: Every day | ORAL | Status: DC
Start: 2014-05-17 — End: 2015-12-18

## 2014-05-17 NOTE — Progress Notes (Signed)
   Subjective:    Patient ID: Cynthia [name], female    DOB: [birth year], 58 y.o.   MRN: [id number]  HPI Patient here for several things as follows  Recent DOT physical. She apparently had trace blood on urine dipstick. She has never seen gross hematuria. She does have a long history of smoking. No flank pain. No reported history of kidney stones. Denies any dysuria or burning of urination. She had urine dipstick 2013 here which was normal.  Right ankle pain. Fell 2 months ago she sprained ankle and had some lateral pain below the lateral malleolus. She then reinjured this a few weeks later. She's had about one month of ankle pain since the last injury with mild swelling. Her pain is mostly below the lateral malleolus. She's not had any recent ecchymosis. No Achilles pain.  History of onychomycosis right great toe. She was prescribed Lamisil last year but lost her prescription. No history of any liver problems. She is requesting repeat treatment now.  Past Medical History  Diagnosis Date  . GERD (gastroesophageal reflux disease)   . Depression   . Bipolar disorder   . COPD (chronic obstructive pulmonary disease)    Past Surgical History  Procedure Laterality Date  . Appendectomy    . Cholecystectomy    . Lumbar laminectomy      6 surgeries    reports that she has been smoking Cigarettes.  She has been smoking about 0.00 packs per day. She does not have any smokeless tobacco history on file. Her alcohol and drug histories are not on file. family history includes Alzheimer's disease in her mother; Colon cancer in her father. Allergies  Allergen Reactions  . Codeine Phosphate       Review of Systems  Constitutional: Negative for fever and chills.  Respiratory: Negative for cough and shortness of breath.   Cardiovascular: Negative for chest pain.  Gastrointestinal: Negative for abdominal pain.  Genitourinary: Negative for dysuria and frequency.       Objective:   Physical  Exam  Constitutional: She appears well-developed and well-nourished.  Cardiovascular: Normal rate and regular rhythm.   Pulmonary/Chest: Effort normal and breath sounds normal. No respiratory distress. She has no wheezes. She has no rales.  Musculoskeletal: She exhibits no edema.  Right ankle reveals no ecchymosis. No warmth. No erythema. She has pain with eversion and somewhat dorsiflexion. She has possibly some very mild edema below the lateral malleolus but no distal fibula or distal tibial tenderness.  Skin:  Brittle dysmorphic nail changes right great toe.          Assessment & Plan:  #1 hematuria on urine dipstick. Urine micro-sent. Urology referral if indicated #2 onychomycosis right great toe. Check hepatic panel. If normal Lamisil 250 mg once daily for 3 months #3 right ankle sprain. No complicating features.  We have recommended Ace wrap. Range of motion stretches and icing after activities. Touch base of not improving over the next month.

## 2014-05-17 NOTE — Patient Instructions (Signed)

## 2014-05-17 NOTE — Progress Notes (Signed)
Pre visit review using our clinic review tool, if applicable. No additional management support is needed unless otherwise documented below in the visit note. 

## 2014-05-18 ENCOUNTER — Other Ambulatory Visit: Payer: Self-pay | Admitting: Family Medicine

## 2014-05-18 DIAGNOSIS — R319 Hematuria, unspecified: Secondary | ICD-10-CM

## 2014-06-14 DIAGNOSIS — F332 Major depressive disorder, recurrent severe without psychotic features: Secondary | ICD-10-CM | POA: Diagnosis not present

## 2014-06-25 ENCOUNTER — Other Ambulatory Visit: Payer: Self-pay | Admitting: Family Medicine

## 2014-06-26 DIAGNOSIS — M5417 Radiculopathy, lumbosacral region: Secondary | ICD-10-CM | POA: Diagnosis not present

## 2014-06-26 DIAGNOSIS — G894 Chronic pain syndrome: Secondary | ICD-10-CM | POA: Diagnosis not present

## 2014-06-26 DIAGNOSIS — Z9689 Presence of other specified functional implants: Secondary | ICD-10-CM | POA: Diagnosis not present

## 2014-06-26 DIAGNOSIS — M545 Low back pain: Secondary | ICD-10-CM | POA: Diagnosis not present

## 2014-06-26 NOTE — Telephone Encounter (Signed)
May refill Zantac for one year.

## 2014-06-26 NOTE — Telephone Encounter (Signed)
Last visit 05/17/14 Last refill 04/10/14 #180 0 refill

## 2014-07-27 ENCOUNTER — Other Ambulatory Visit: Payer: Self-pay | Admitting: Family Medicine

## 2014-08-03 ENCOUNTER — Other Ambulatory Visit: Payer: Self-pay | Admitting: Family Medicine

## 2014-08-18 ENCOUNTER — Encounter: Payer: Self-pay | Admitting: Internal Medicine

## 2014-09-21 DIAGNOSIS — Z9689 Presence of other specified functional implants: Secondary | ICD-10-CM | POA: Diagnosis not present

## 2014-09-21 DIAGNOSIS — G8929 Other chronic pain: Secondary | ICD-10-CM | POA: Diagnosis not present

## 2014-09-21 DIAGNOSIS — M549 Dorsalgia, unspecified: Secondary | ICD-10-CM | POA: Diagnosis not present

## 2014-09-21 DIAGNOSIS — G894 Chronic pain syndrome: Secondary | ICD-10-CM | POA: Diagnosis not present

## 2014-09-21 DIAGNOSIS — M5417 Radiculopathy, lumbosacral region: Secondary | ICD-10-CM | POA: Diagnosis not present

## 2014-10-13 DIAGNOSIS — F332 Major depressive disorder, recurrent severe without psychotic features: Secondary | ICD-10-CM | POA: Diagnosis not present

## 2014-11-24 ENCOUNTER — Other Ambulatory Visit: Payer: Self-pay | Admitting: Family Medicine

## 2014-11-29 ENCOUNTER — Other Ambulatory Visit: Payer: Self-pay | Admitting: Family Medicine

## 2014-12-14 ENCOUNTER — Telehealth: Payer: Self-pay | Admitting: *Deleted

## 2014-12-14 NOTE — Telephone Encounter (Signed)
Left call back number on phone unable to leave message. Called to see if had Mammogram done.

## 2014-12-26 DIAGNOSIS — M542 Cervicalgia: Secondary | ICD-10-CM | POA: Diagnosis not present

## 2014-12-26 DIAGNOSIS — M5417 Radiculopathy, lumbosacral region: Secondary | ICD-10-CM | POA: Diagnosis not present

## 2014-12-26 DIAGNOSIS — G894 Chronic pain syndrome: Secondary | ICD-10-CM | POA: Diagnosis not present

## 2014-12-26 DIAGNOSIS — Z9689 Presence of other specified functional implants: Secondary | ICD-10-CM | POA: Diagnosis not present

## 2015-01-11 DIAGNOSIS — F332 Major depressive disorder, recurrent severe without psychotic features: Secondary | ICD-10-CM | POA: Diagnosis not present

## 2015-02-13 ENCOUNTER — Other Ambulatory Visit: Payer: Self-pay | Admitting: Family Medicine

## 2015-02-21 ENCOUNTER — Encounter: Payer: Self-pay | Admitting: *Deleted

## 2015-03-06 ENCOUNTER — Other Ambulatory Visit: Payer: Self-pay | Admitting: Family Medicine

## 2015-03-07 DIAGNOSIS — M542 Cervicalgia: Secondary | ICD-10-CM | POA: Diagnosis not present

## 2015-03-07 DIAGNOSIS — M47812 Spondylosis without myelopathy or radiculopathy, cervical region: Secondary | ICD-10-CM | POA: Diagnosis not present

## 2015-03-27 DIAGNOSIS — G894 Chronic pain syndrome: Secondary | ICD-10-CM | POA: Diagnosis not present

## 2015-04-12 ENCOUNTER — Other Ambulatory Visit: Payer: Self-pay | Admitting: Family Medicine

## 2015-05-27 ENCOUNTER — Other Ambulatory Visit: Payer: Self-pay | Admitting: Family Medicine

## 2015-06-11 DIAGNOSIS — F332 Major depressive disorder, recurrent severe without psychotic features: Secondary | ICD-10-CM | POA: Diagnosis not present

## 2015-06-21 ENCOUNTER — Other Ambulatory Visit: Payer: Self-pay | Admitting: Family Medicine

## 2015-06-26 DIAGNOSIS — G894 Chronic pain syndrome: Secondary | ICD-10-CM | POA: Diagnosis not present

## 2015-06-26 DIAGNOSIS — Z9689 Presence of other specified functional implants: Secondary | ICD-10-CM | POA: Diagnosis not present

## 2015-07-04 ENCOUNTER — Other Ambulatory Visit: Payer: Self-pay | Admitting: Family Medicine

## 2015-07-10 ENCOUNTER — Other Ambulatory Visit: Payer: Self-pay | Admitting: Family Medicine

## 2015-07-25 ENCOUNTER — Other Ambulatory Visit: Payer: Self-pay | Admitting: Family Medicine

## 2015-07-31 ENCOUNTER — Ambulatory Visit: Payer: Medicare Other | Admitting: Family Medicine

## 2015-08-06 ENCOUNTER — Ambulatory Visit: Payer: Medicare Other | Admitting: Family Medicine

## 2015-08-08 ENCOUNTER — Ambulatory Visit: Payer: Medicare Other | Admitting: Family Medicine

## 2015-08-18 ENCOUNTER — Other Ambulatory Visit: Payer: Self-pay | Admitting: Family Medicine

## 2015-08-20 ENCOUNTER — Encounter: Payer: Self-pay | Admitting: Family Medicine

## 2015-08-23 ENCOUNTER — Ambulatory Visit: Payer: Medicare Other | Admitting: Family Medicine

## 2015-08-28 ENCOUNTER — Other Ambulatory Visit: Payer: Self-pay | Admitting: Family Medicine

## 2015-09-12 ENCOUNTER — Ambulatory Visit: Payer: Self-pay | Admitting: Family Medicine

## 2015-09-19 ENCOUNTER — Encounter: Payer: Self-pay | Admitting: Family Medicine

## 2015-09-20 MED ORDER — ONDANSETRON HCL 8 MG PO TABS: 8.0000 mg | ORAL_TABLET | Freq: Three times a day (TID) | ORAL | Status: DC | PRN

## 2015-09-20 NOTE — Telephone Encounter (Signed)
This patient has not been seen since 2015

## 2015-09-20 NOTE — Telephone Encounter (Signed)
Verbally called in for patient.  

## 2015-09-23 ENCOUNTER — Other Ambulatory Visit: Payer: Self-pay | Admitting: Family Medicine

## 2015-09-25 DIAGNOSIS — Z79899 Other long term (current) drug therapy: Secondary | ICD-10-CM | POA: Diagnosis not present

## 2015-09-25 DIAGNOSIS — G894 Chronic pain syndrome: Secondary | ICD-10-CM | POA: Diagnosis not present

## 2015-09-25 DIAGNOSIS — M5417 Radiculopathy, lumbosacral region: Secondary | ICD-10-CM | POA: Diagnosis not present

## 2015-09-25 DIAGNOSIS — M545 Low back pain: Secondary | ICD-10-CM | POA: Diagnosis not present

## 2015-09-25 DIAGNOSIS — Z9689 Presence of other specified functional implants: Secondary | ICD-10-CM | POA: Diagnosis not present

## 2015-09-25 DIAGNOSIS — Z5181 Encounter for therapeutic drug level monitoring: Secondary | ICD-10-CM | POA: Diagnosis not present

## 2015-09-26 ENCOUNTER — Ambulatory Visit (INDEPENDENT_AMBULATORY_CARE_PROVIDER_SITE_OTHER): Payer: Medicare Other | Admitting: Family Medicine

## 2015-09-26 VITALS — BP 122/86 | HR 108 | Temp 99.6°F | Ht 67.0 in | Wt 216.6 lb

## 2015-09-26 DIAGNOSIS — K219 Gastro-esophageal reflux disease without esophagitis: Secondary | ICD-10-CM

## 2015-09-26 DIAGNOSIS — J449 Chronic obstructive pulmonary disease, unspecified: Secondary | ICD-10-CM | POA: Diagnosis not present

## 2015-09-26 DIAGNOSIS — N3941 Urge incontinence: Secondary | ICD-10-CM

## 2015-09-26 MED ORDER — TIOTROPIUM BROMIDE MONOHYDRATE 18 MCG IN CAPS: 18.0000 ug | ORAL_CAPSULE | Freq: Every day | RESPIRATORY_TRACT | Status: DC

## 2015-09-26 NOTE — Progress Notes (Signed)
   Subjective:    Patient ID: Cynthia [name], female    DOB: [birth year], 60 y.o.   MRN: [id number]  HPI  Patient seen for medical follow-up.  Chronic problems include obesity, urinary urge incontinence, ongoing nicotine use, COPD, GERD, history of bipolar disorder. She also has chronic pain syndrome with chronic back pain. She is followed by pain management and by psychiatry.   Urinary urgency symptoms are stable on Toviaz. Denies any burning with urination. Tries to watch caffeine intake.   COPD. She is currently on combination therapy with Advair and Spiriva. Requesting refills of Spiriva. She has had some gradual progression of dyspnea over time. Occasional cough. No hemoptysis. Denies recent fevers or chills. Rarely uses rescue inhaler. She did not get flu vaccination this year   she's not had complete physical in quite some time. No recent lab work.   She has history of GERD which is been stable on Zantac over-the-counter. No recent dysphagia. She has declined colonoscopies in the past  Past Medical History  Diagnosis Date  . GERD (gastroesophageal reflux disease)   . Depression   . Bipolar disorder   . COPD (chronic obstructive pulmonary disease)    Past Surgical History  Procedure Laterality Date  . Appendectomy    . Cholecystectomy    . Lumbar laminectomy      6 surgeries    reports that she has been smoking Cigarettes.  She does not have any smokeless tobacco history on file. Her alcohol and drug histories are not on file. family history includes Alzheimer's disease in her mother; Colon cancer in her father. Allergies  Allergen Reactions  . Codeine Phosphate       Review of Systems  Constitutional: Negative for fever, chills and fatigue.  HENT: Negative for trouble swallowing.   Respiratory: Positive for shortness of breath. Negative for cough and wheezing.   Cardiovascular: Negative for chest pain, palpitations and leg swelling.  Gastrointestinal: Negative for  nausea, vomiting, abdominal pain, diarrhea and constipation.  Genitourinary: Positive for frequency. Negative for dysuria, flank pain, decreased urine volume and difficulty urinating.       Objective:   Physical Exam  Constitutional: She appears well-developed and well-nourished.  HENT:  Mouth/Throat: Oropharynx is clear and moist.  Neck: Neck supple. No thyromegaly present.  Cardiovascular: Normal rate and regular rhythm.   Pulmonary/Chest: Effort normal and breath sounds normal. No respiratory distress. She has no wheezes. She has no rales.  Musculoskeletal: She exhibits no edema.  Psychiatric: She has a normal mood and affect. Her behavior is normal.          Assessment & Plan:   #1 COPD. We spent quite some time discussing smoking cessation. Would not feel comfortable prescribing Chantix unless okayed by psychiatry. She will discuss with them. Refill Spiriva. Continue Advair. Continue pro-air as needed.  Patient states she had Pneumovax within the past couple of years.   #2 GERD. Symptomatically stable on Zantac.   #3 urinary urgency. Symptomatically stable on Toviaz.   #4 health maintenance. Overdue for physical. We've advised scheduling this.

## 2015-09-26 NOTE — Patient Instructions (Signed)
Schedule complete physical. 

## 2015-09-26 NOTE — Progress Notes (Signed)
Pre visit review using our clinic review tool, if applicable. No additional management support is needed unless otherwise documented below in the visit note. 

## 2015-10-04 ENCOUNTER — Encounter: Payer: Self-pay | Admitting: Family Medicine

## 2015-10-12 ENCOUNTER — Encounter: Payer: Self-pay | Admitting: Family Medicine

## 2015-10-18 ENCOUNTER — Other Ambulatory Visit: Payer: Self-pay | Admitting: Family Medicine

## 2015-10-18 ENCOUNTER — Encounter: Payer: Self-pay | Admitting: Family Medicine

## 2015-10-18 MED ORDER — FLUTICASONE-SALMETEROL 100-50 MCG/DOSE IN AEPB: 1.0000 | INHALATION_SPRAY | Freq: Two times a day (BID) | RESPIRATORY_TRACT | Status: DC

## 2015-10-18 NOTE — Telephone Encounter (Signed)
I am not sure which inhaler pt is on. In the e-mail it looks like Aurther LoftSpriva was approved but I do not see it on medication list.

## 2015-10-20 ENCOUNTER — Other Ambulatory Visit: Payer: Self-pay | Admitting: Family Medicine

## 2015-10-29 DIAGNOSIS — F332 Major depressive disorder, recurrent severe without psychotic features: Secondary | ICD-10-CM | POA: Diagnosis not present

## 2015-11-09 ENCOUNTER — Encounter: Payer: Self-pay | Admitting: Family Medicine

## 2015-11-15 DIAGNOSIS — Z9689 Presence of other specified functional implants: Secondary | ICD-10-CM | POA: Diagnosis not present

## 2015-11-15 DIAGNOSIS — G894 Chronic pain syndrome: Secondary | ICD-10-CM | POA: Diagnosis not present

## 2015-11-15 DIAGNOSIS — M545 Low back pain: Secondary | ICD-10-CM | POA: Diagnosis not present

## 2015-11-15 DIAGNOSIS — G8929 Other chronic pain: Secondary | ICD-10-CM | POA: Diagnosis not present

## 2015-12-07 ENCOUNTER — Ambulatory Visit: Payer: Medicare Other | Admitting: Family Medicine

## 2015-12-18 ENCOUNTER — Ambulatory Visit (INDEPENDENT_AMBULATORY_CARE_PROVIDER_SITE_OTHER): Payer: Medicare Other | Admitting: Family Medicine

## 2015-12-18 ENCOUNTER — Encounter: Payer: Self-pay | Admitting: Family Medicine

## 2015-12-18 VITALS — BP 148/88 | HR 115 | Temp 98.7°F | Ht 67.0 in | Wt 218.6 lb

## 2015-12-18 DIAGNOSIS — R5383 Other fatigue: Secondary | ICD-10-CM

## 2015-12-18 DIAGNOSIS — Z87448 Personal history of other diseases of urinary system: Secondary | ICD-10-CM

## 2015-12-18 LAB — POCT URINALYSIS DIPSTICK
Glucose, UA: NEGATIVE
Nitrite, UA: NEGATIVE
Spec Grav, UA: >=1.03
Urobilinogen, UA: 1
pH, UA: 6

## 2015-12-18 NOTE — Patient Instructions (Signed)
Fatigue  Fatigue is feeling tired all of the time, a lack of energy, or a lack of motivation. Occasional or mild fatigue is often a normal response to activity or life in general. However, long-lasting (chronic) or extreme fatigue may indicate an underlying medical condition.  HOME CARE INSTRUCTIONS   Watch your fatigue for any changes. The following actions may help to lessen any discomfort you are feeling:  · Talk to your health care provider about how much sleep you need each night. Try to get the required amount every night.  · Take medicines only as directed by your health care provider.  · Eat a healthy and nutritious diet. Ask your health care provider if you need help changing your diet.  · Drink enough fluid to keep your urine clear or pale yellow.  · Practice ways of relaxing, such as yoga, meditation, massage therapy, or acupuncture.  · Exercise regularly.    · Change situations that cause you stress. Try to keep your work and personal routine reasonable.  · Do not abuse illegal drugs.  · Limit alcohol intake to no more than 1 drink per day for nonpregnant women and 2 drinks per day for men. One drink equals 12 ounces of beer, 5 ounces of wine, or 1½ ounces of hard liquor.  · Take a multivitamin, if directed by your health care provider.  SEEK MEDICAL CARE IF:   · Your fatigue does not get better.  · You have a fever.    · You have unintentional weight loss or gain.  · You have headaches.    · You have difficulty:      Falling asleep.    Sleeping throughout the night.  · You feel angry, guilty, anxious, or sad.     · You are unable to have a bowel movement (constipation).    · You skin is dry.     · Your legs or another part of your body is swollen.    SEEK IMMEDIATE MEDICAL CARE IF:   · You feel confused.    · Your vision is blurry.  · You feel faint or pass out.    · You have a severe headache.    · You have severe abdominal, pelvic, or back pain.    · You have chest pain, shortness of breath, or an  irregular or fast heartbeat.    · You are unable to urinate or you urinate less than normal.    · You develop abnormal bleeding, such as bleeding from the rectum, vagina, nose, lungs, or nipples.  · You vomit blood.     · You have thoughts about harming yourself or committing suicide.    · You are worried that you might harm someone else.       This information is not intended to replace advice given to you by your health care provider. Make sure you discuss any questions you have with your health care provider.     Document Released: 04/27/2007 Document Revised: 07/21/2014 Document Reviewed: 11/01/2013  Elsevier Interactive Patient Education ©2016 [employer]

## 2015-12-18 NOTE — Progress Notes (Signed)
Subjective:    Patient ID: Cynthia [name], female    DOB: [birth year], 60 y.o.   MRN: [id number]  HPI Patient comes in today with 6 month history of some progressive fatigue and malaise.  Chronic problems include history of obesity, GERD, depression, bipolar 2 disorder, and COPD. Ongoing nicotine use. She states she is sleeping okay. Appetite and weight are stable. Denies any depression symptoms. No chest pains. She has some chronic dyspnea with exertion which is unchanged. Ongoing nicotine use but no recent progressive cough. No hemoptysis. She has chronic back pain and is on Dilaudid pump which she's been on for quite some time. She is followed by pain management. No known history of obstructive sleep apnea. Does have some daytime somnolence Lives alone so no observed OSA.   Medications reviewed. She has some urinary urgency and takes medication for that. She denies any significant nocturia. She is sleeping through the night uninterrupted. She's not had any recent lab work.  She had microscopic hematuria 2 years ago when she was here and we had set up urology referral but she never went. She has never had gross hematuria. No history of kidney stones  Past Medical History  Diagnosis Date  . GERD (gastroesophageal reflux disease)   . Depression   . Bipolar disorder (HCC)   . COPD (chronic obstructive pulmonary disease) [facility])    Past Surgical History  Procedure Laterality Date  . Appendectomy    . Cholecystectomy    . Lumbar laminectomy      6 surgeries    reports that she has been smoking Cigarettes.  She does not have any smokeless tobacco history on file. Her alcohol and drug histories are not on file. family history includes Alzheimer's disease in her mother; Colon cancer in her father. Allergies  Allergen Reactions  . Codeine Phosphate       Review of Systems  Constitutional: Positive for fatigue. Negative for fever, chills, appetite change and unexpected weight  change.  HENT: Negative for congestion, sore throat and trouble swallowing.   Respiratory: Positive for shortness of breath. Negative for cough.   Cardiovascular: Negative for chest pain, palpitations and leg swelling.  Gastrointestinal: Negative for nausea, vomiting, abdominal pain and blood in stool.  Endocrine: Negative for polydipsia and polyuria.  Genitourinary: Negative for dysuria.  Skin: Negative for rash.  Neurological: Negative for dizziness, tremors, syncope, weakness and headaches.  Hematological: Negative for adenopathy.  Psychiatric/Behavioral: Negative for sleep disturbance and dysphoric mood.       Objective:   Physical Exam  Constitutional: She is oriented to person, place, and time. She appears well-developed and well-nourished. No distress.  HENT:  Right Ear: External ear normal.  Left Ear: External ear normal.  Mouth/Throat: Oropharynx is clear and moist.  Neck: Neck supple. No thyromegaly present.  Cardiovascular: Normal rate and regular rhythm.   Pulmonary/Chest: Effort normal. No respiratory distress. She has no wheezes. She has no rales.  Musculoskeletal: She exhibits no edema.  Lymphadenopathy:    She has no cervical adenopathy.  Neurological: She is alert and oriented to person, place, and time. No cranial nerve deficit.  Psychiatric: She has a normal mood and affect. Her behavior is normal. Judgment and thought content normal.          Assessment & Plan:  Patient resents with nonspecific symptoms of malaise and fatigue. Progressive over several months. No other specific symptoms. Likely multifactorial. We explained her chronic opioid use could certainly be contributing.  No evidence  for depression. Check further labs with TSH, CBC, comprehensive metabolic panel, sedimentation rate, urinalysis.  Follow up in 2 weeks.  If labs all OK explore other possibilities such as OSA.  Kristian CoveyBruce W Kensey Luepke MD [facility]

## 2015-12-18 NOTE — Progress Notes (Signed)
Pre visit review using our clinic review tool, if applicable. No additional management support is needed unless otherwise documented below in the visit note. 

## 2015-12-19 ENCOUNTER — Other Ambulatory Visit: Payer: Self-pay | Admitting: Family Medicine

## 2015-12-19 ENCOUNTER — Other Ambulatory Visit (INDEPENDENT_AMBULATORY_CARE_PROVIDER_SITE_OTHER): Payer: Medicare Other

## 2015-12-19 DIAGNOSIS — E875 Hyperkalemia: Secondary | ICD-10-CM

## 2015-12-19 LAB — BASIC METABOLIC PANEL
BUN: 10 mg/dL (ref 6–23)
BUN: 9 mg/dL (ref 6–23)
CALCIUM: 9.4 mg/dL (ref 8.4–10.5)
CHLORIDE: 103 meq/L (ref 96–112)
CO2: 27 mEq/L (ref 19–32)
CO2: 27 meq/L (ref 19–32)
CREATININE: 0.72 mg/dL (ref 0.40–1.20)
Calcium: 9.6 mg/dL (ref 8.4–10.5)
Chloride: 102 mEq/L (ref 96–112)
Creatinine, Ser: 0.79 mg/dL (ref 0.40–1.20)
GFR: 78.43 mL/min (ref >60.00)
GFR: 87.29 mL/min (ref >60.00)
GLUCOSE: 94 mg/dL (ref 70–99)
Glucose, Bld: 85 mg/dL (ref 70–99)
POTASSIUM: 6.9 meq/L — AB (ref 3.5–5.1)
Potassium: 3.7 mEq/L (ref 3.5–5.1)
Sodium: 136 mEq/L (ref 135–145)
Sodium: 135 mEq/L (ref 135–145)

## 2015-12-19 LAB — CBC WITH DIFFERENTIAL/PLATELET
BASOS PCT: 0.6 % (ref 0.0–3.0)
Basophils Absolute: 0.1 10*3/uL (ref 0.0–0.1)
EOS PCT: 1.8 % (ref 0.0–5.0)
Eosinophils Absolute: 0.1 10*3/uL (ref 0.0–0.7)
HEMATOCRIT: 42.8 % (ref 36.0–46.0)
HEMOGLOBIN: 14.5 g/dL (ref 12.0–15.0)
LYMPHS PCT: 19.5 % (ref 12.0–46.0)
Lymphs Abs: 1.6 10*3/uL (ref 0.7–4.0)
MCHC: 33.9 g/dL (ref 30.0–36.0)
MCV: 84 fl (ref 78.0–100.0)
MONO ABS: 0.3 10*3/uL (ref 0.1–1.0)
Monocytes Relative: 3.9 % (ref 3.0–12.0)
Neutro Abs: 6 10*3/uL (ref 1.4–7.7)
Neutrophils Relative %: 74.2 % (ref 43.0–77.0)
Platelets: 343 10*3/uL (ref 150.0–400.0)
RBC: 5.1 Mil/uL (ref 3.87–5.11)
RDW: 14.8 % (ref 11.5–15.5)
WBC: 8.1 10*3/uL (ref 4.0–10.5)

## 2015-12-19 LAB — SEDIMENTATION RATE: Sed Rate: 38 mm/hr — ABNORMAL HIGH (ref 0–30)

## 2015-12-19 LAB — TSH: TSH: 6.38 u[IU]/mL — ABNORMAL HIGH (ref 0.35–4.50)

## 2015-12-19 LAB — HEPATIC FUNCTION PANEL
ALK PHOS: 97 U/L (ref 39–117)
ALT: 21 U/L (ref 0–35)
AST: 20 U/L (ref 0–37)
Albumin: 4.2 g/dL (ref 3.5–5.2)
Bilirubin, Direct: 0.1 mg/dL (ref 0.0–0.3)
Total Bilirubin: 0.3 mg/dL (ref 0.2–1.2)
Total Protein: 7.1 g/dL (ref 6.0–8.3)

## 2015-12-20 ENCOUNTER — Encounter: Payer: Self-pay | Admitting: Family Medicine

## 2015-12-21 ENCOUNTER — Other Ambulatory Visit: Payer: Self-pay | Admitting: Family Medicine

## 2015-12-21 DIAGNOSIS — R319 Hematuria, unspecified: Secondary | ICD-10-CM

## 2015-12-21 NOTE — Telephone Encounter (Signed)
Looks like there was a Trace this time as well. Should this be concerning for her?

## 2015-12-22 ENCOUNTER — Other Ambulatory Visit: Payer: Self-pay | Admitting: Family Medicine

## 2015-12-25 DIAGNOSIS — M5417 Radiculopathy, lumbosacral region: Secondary | ICD-10-CM | POA: Diagnosis not present

## 2015-12-25 DIAGNOSIS — G894 Chronic pain syndrome: Secondary | ICD-10-CM | POA: Diagnosis not present

## 2015-12-25 DIAGNOSIS — G8929 Other chronic pain: Secondary | ICD-10-CM | POA: Diagnosis not present

## 2015-12-25 DIAGNOSIS — M545 Low back pain: Secondary | ICD-10-CM | POA: Diagnosis not present

## 2016-01-01 ENCOUNTER — Ambulatory Visit (INDEPENDENT_AMBULATORY_CARE_PROVIDER_SITE_OTHER): Payer: Medicare Other | Admitting: Family Medicine

## 2016-01-01 VITALS — BP 122/80 | HR 115 | Temp 98.9°F | Ht 67.0 in | Wt 218.0 lb

## 2016-01-01 DIAGNOSIS — G4710 Hypersomnia, unspecified: Secondary | ICD-10-CM | POA: Diagnosis not present

## 2016-01-01 DIAGNOSIS — R319 Hematuria, unspecified: Secondary | ICD-10-CM | POA: Diagnosis not present

## 2016-01-01 DIAGNOSIS — R5383 Other fatigue: Secondary | ICD-10-CM | POA: Diagnosis not present

## 2016-01-01 DIAGNOSIS — R7989 Other specified abnormal findings of blood chemistry: Secondary | ICD-10-CM

## 2016-01-01 DIAGNOSIS — R400 Somnolence: Secondary | ICD-10-CM

## 2016-01-01 NOTE — Progress Notes (Signed)
Pre visit review using our clinic review tool, if applicable. No additional management support is needed unless otherwise documented below in the visit note. 

## 2016-01-01 NOTE — Progress Notes (Signed)
   Subjective:    Patient ID: Cynthia [name], female    DOB: [birth year], 60 y.o.   MRN: [id number]  HPI Patient seen for follow-up regarding progressive fatigue issues. Refer to prior note for details. Denied any appetite or weight changes, active depression symptoms, chest pain. She is on chronic opioids for chronic back pain and is followed by pain management. We obtained several labs significant for TSH 6.38 Potassium came back initially 6.9 and repeat was normal so this was likely hemolyzed. Urine dipstick revealed trace blood. She was referred to urology couple years ago for microscopic hematuria but never went. Denies any flank pain or abdominal pain.  Patient states she had some type of sleep study about 12 years ago with reported sleep apnea but she never followed up on the study as her physician was in the process of retiring. She lives alone. Frequent daytime somnolence and fatigue.  Past Medical History  Diagnosis Date  . GERD (gastroesophageal reflux disease)   . Depression   . Bipolar disorder (HCC)   . COPD (chronic obstructive pulmonary disease) [facility](HCC)    Past Surgical History  Procedure Laterality Date  . Appendectomy    . Cholecystectomy    . Lumbar laminectomy      6 surgeries    reports that she has been smoking Cigarettes.  She does not have any smokeless tobacco history on file. Her alcohol and drug histories are not on file. family history includes Alzheimer's disease in her mother; Colon cancer in her father. Allergies  Allergen Reactions  . Codeine Phosphate        Review of Systems  Constitutional: Positive for fatigue. Negative for fever, chills, appetite change and unexpected weight change.  Respiratory: Positive for shortness of breath (Chronic). Negative for cough.   Gastrointestinal: Negative for abdominal pain.  Genitourinary: Negative for dysuria and hematuria.  Musculoskeletal: Positive for back pain.  Neurological: Negative for  dizziness.  Psychiatric/Behavioral: Negative for sleep disturbance and dysphoric mood.       Objective:   Physical Exam  Constitutional: She appears well-developed and well-nourished. No distress.  Neck: Neck supple. No thyromegaly present.  Cardiovascular: Normal rate and regular rhythm.   Pulmonary/Chest: Effort normal and breath sounds normal. No respiratory distress. She has no wheezes. She has no rales.  Musculoskeletal: She exhibits no edema.  Neurological: She is alert.  Psychiatric: She has a normal mood and affect. Her behavior is normal. Judgment and thought content normal.          Assessment & Plan:  #1 chronic fatigue symptoms. Likely multifactorial. She has slightly elevated TSH. She is on chronic opioids. Relatively sedentary. Possible obstructive sleep apnea (see below).  Epworth sleep scale score of 20. Will refer for workup for obstructive sleep apnea  #2 abnormal TSH of 6.38. Repeat TSH with free T4. Consider initiating low-dose thyroid replacement if abnormal  #3 hematuria on recent dipstick. Repeat urinalysis today with reflux microscopy.  Kristian CoveyBruce W Shuntia Exton MD [facility]

## 2016-01-02 LAB — URINALYSIS, ROUTINE W REFLEX MICROSCOPIC
NITRITE: NEGATIVE
PH: 5.5 (ref 5.0–8.0)
Specific Gravity, Urine: >=1.03 — AB (ref 1.000–1.030)
URINE GLUCOSE: NEGATIVE
UROBILINOGEN UA: 0.2 (ref 0.0–1.0)

## 2016-01-02 LAB — T4, FREE: FREE T4: 0.67 ng/dL (ref 0.60–1.60)

## 2016-01-02 LAB — TSH: TSH: 7.94 u[IU]/mL — ABNORMAL HIGH (ref 0.35–4.50)

## 2016-01-04 ENCOUNTER — Encounter: Payer: Self-pay | Admitting: Family Medicine

## 2016-01-07 ENCOUNTER — Other Ambulatory Visit: Payer: Self-pay

## 2016-01-07 DIAGNOSIS — E039 Hypothyroidism, unspecified: Secondary | ICD-10-CM

## 2016-01-07 MED ORDER — LEVOTHYROXINE SODIUM 25 MCG PO TABS: 25.0000 ug | ORAL_TABLET | Freq: Every day | ORAL | Status: DC

## 2016-01-07 NOTE — Telephone Encounter (Signed)
Please review labs. 

## 2016-01-07 NOTE — Telephone Encounter (Signed)
Future lab ordered. Medication sent to pharmacy.

## 2016-01-09 ENCOUNTER — Encounter: Payer: Self-pay | Admitting: Family Medicine

## 2016-01-14 ENCOUNTER — Encounter: Payer: Self-pay | Admitting: Family Medicine

## 2016-01-25 ENCOUNTER — Other Ambulatory Visit: Payer: Self-pay | Admitting: Family Medicine

## 2016-01-25 DIAGNOSIS — R5383 Other fatigue: Secondary | ICD-10-CM

## 2016-01-25 DIAGNOSIS — R400 Somnolence: Secondary | ICD-10-CM

## 2016-02-02 ENCOUNTER — Encounter: Payer: Self-pay | Admitting: Family Medicine

## 2016-02-12 ENCOUNTER — Encounter: Payer: Self-pay | Admitting: Family Medicine

## 2016-02-18 DIAGNOSIS — G4733 Obstructive sleep apnea (adult) (pediatric): Secondary | ICD-10-CM | POA: Diagnosis not present

## 2016-02-18 DIAGNOSIS — R0683 Snoring: Secondary | ICD-10-CM | POA: Diagnosis not present

## 2016-02-27 ENCOUNTER — Encounter: Payer: Self-pay | Admitting: Family Medicine

## 2016-03-18 ENCOUNTER — Institutional Professional Consult (permissible substitution): Payer: Medicare Other | Admitting: Pulmonary Disease

## 2016-03-19 ENCOUNTER — Encounter: Payer: Self-pay | Admitting: Pulmonary Disease

## 2016-03-19 ENCOUNTER — Ambulatory Visit (INDEPENDENT_AMBULATORY_CARE_PROVIDER_SITE_OTHER): Payer: Medicare Other | Admitting: Pulmonary Disease

## 2016-03-19 VITALS — BP 138/78 | HR 86 | Ht 67.0 in | Wt 223.0 lb

## 2016-03-19 DIAGNOSIS — Z720 Tobacco use: Secondary | ICD-10-CM

## 2016-03-19 DIAGNOSIS — G4733 Obstructive sleep apnea (adult) (pediatric): Secondary | ICD-10-CM | POA: Insufficient documentation

## 2016-03-19 DIAGNOSIS — J430 Unilateral pulmonary emphysema [MacLeod's syndrome]: Secondary | ICD-10-CM | POA: Diagnosis not present

## 2016-03-19 NOTE — Progress Notes (Signed)
Subjective:    Patient ID: Cynthia [name], female    DOB: 01/07/1956, 60 y.o.   MRN: [id number]  HPI   This is the case of Cynthia [name], 60 y.o. Female, who was referred by Dr. Evelena Peat in consultation regarding OSA.   As you very well know, patient has a 65 PY smoking history, smokes 1.5 PPD, has COPD which has been stable, not on O2. She has depression and anxiety which are stable.   Patient lives by herself. Has snoring, witnessed apneas, occasional gasping and choking, has frequent awakenings at night.  She ends up sleeping 6 hrs/night. Patient has unrefreshed sleep. She has hypersomnia. Hypersomnia affects her functionality. 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  Patient is on Dilaudid pump because of her chronic pain. She is also on Abilify and Adderall for mental health issues. Initially, Adderall was given for her cognitive impairment. She continues to take Adderall to keep her awake.   A split-night study on March 03, 2016 with an AHI of 23. No optimal settings were obtained on CPAP. At CPAP starting at 12 cm water, she started having central apneas. At CPAP 11 cm water, her AHI was 9.2 but she only had  6.5 minutes of sleep. Her PLMSI  was elevated at 96.    Review of Systems  Constitutional: Negative.  Negative for fever and unexpected weight change.  HENT: Negative for congestion, dental problem, ear pain, nosebleeds, postnasal drip, rhinorrhea, sinus pressure, sneezing, sore throat and trouble swallowing.   Eyes: Negative.  Negative for redness and itching.  Respiratory: Positive for cough, shortness of breath and wheezing. Negative for chest tightness.   Cardiovascular: Negative.  Negative for palpitations and leg swelling.  Gastrointestinal: Negative.  Negative for nausea and vomiting.  Endocrine: Negative.   Genitourinary: Negative.  Negative for dysuria.  Musculoskeletal: Positive for back pain. Negative for joint swelling.  Skin:  Negative.  Negative for rash.  Allergic/Immunologic: Negative.   Neurological: Positive for headaches.  Hematological: Negative.  Does not bruise/bleed easily.  Psychiatric/Behavioral: Negative.  Negative for dysphoric mood. The patient is not nervous/anxious.     Past Medical History:  Diagnosis Date  . Bipolar disorder (HCC)   . COPD (chronic obstructive pulmonary disease) (HCC)   . Depression   . GERD (gastroesophageal reflux disease)    (-) CA, DVT  Family History  Problem Relation Age of Onset  . Alzheimer's disease Mother   . Colon cancer Father      Past Surgical History:  Procedure Laterality Date  . APPENDECTOMY    . CHOLECYSTECTOMY    . LUMBAR LAMINECTOMY     6 surgeries    Social History   Social History  . Marital status: Divorced    Spouse name: N/A  . Number of children: N/A  . Years of education: N/A   Occupational History  . Not on file.   Social History Main Topics  . Smoking status: Current Every Day Smoker    Types: Cigarettes  . Smokeless tobacco: Not on file  . Alcohol use Not on file  . Drug use: Unknown  . Sexual activity: Not on file   Other Topics Concern  . Not on file   Social History Narrative  . No narrative on file     Allergies  Allergen Reactions  . Codeine Phosphate    Lives in [place]. Worked as  A Engineer, production. Drives all day. (-) ETOH  Outpatient  Medications Prior to Visit  Medication Sig Dispense Refill  . amphetamine-dextroamphetamine (ADDERALL) 20 MG tablet Take 20 mg by mouth 3 (three) times daily.     . ARIPiprazole (ABILIFY) 15 MG tablet Take 15 mg by mouth daily.      . Fluticasone-Salmeterol (ADVAIR DISKUS) 100-50 MCG/DOSE AEPB Inhale 1 puff into the lungs 2 (two) times daily at 10 AM and 5 PM. 60 each 2  . HYDROmorphone, PF, (DILAUDID HP) 250 MG injection Inject as directed. PT CURRENTLY WEARS A PAIN PUMP.    Marland Kitchen levothyroxine (LEVOTHROID) 25 MCG tablet Take 1 tablet (25 mcg total) by mouth daily before  breakfast. 30 tablet 5  . ranitidine (ZANTAC) 150 MG tablet TAKE 1 TABLET TWICE DAILY 60 tablet 11  . TOVIAZ 4 MG TB24 tablet TAKE 1 TABLET BY MOUTH EVERY DAY 30 tablet 5  . venlafaxine (EFFEXOR) 75 MG tablet Take 225 mg by mouth daily.     . VENTOLIN HFA 108 (90 Base) MCG/ACT inhaler INHALE 2 PUFFS INTO THE LUNGS EVERY 4 (FOUR) HOURS AS NEEDED FOR WHEEZING OR SHORTNESS OF BREATH. 18 Inhaler 1  . ondansetron (ZOFRAN) 8 MG tablet Take 1 tablet (8 mg total) by mouth every 8 (eight) hours as needed for nausea or vomiting. 8 tablet 0   No facility-administered medications prior to visit.    No orders of the defined types were placed in this encounter.        Objective:   Physical Exam  Vitals:  Vitals:   03/19/16 0956  BP: 138/78  Pulse: 86  SpO2: 96%  Weight: 223 lb (101.2 kg)  Height: 5\' 7"  (1.702 m)    Constitutional/General:  Pleasant, well-nourished, well-developed, not in any distress,  Comfortably seating.  Well kempt  Body mass index is 34.93 kg/m. Wt Readings from Last 3 Encounters:  03/19/16 223 lb (101.2 kg)  01/01/16 218 lb (98.9 kg)  12/18/15 218 lb 9.6 oz (99.2 kg)    Neck circumference: 19 in  HEENT: Pupils equal and reactive to light and accommodation. Anicteric sclerae. Normal nasal mucosa.   No oral  lesions,  mouth clear,  oropharynx clear, no postnasal drip. (-) Oral thrush. No dental caries.  Airway - Mallampati class III  Neck: No masses. Midline trachea. No JVD, (-) LAD. (-) bruits appreciated.  Respiratory/Chest: Grossly normal chest. (-) deformity. (-) Accessory muscle use.  Symmetric expansion. (-) Tenderness on palpation.  Resonant on percussion.  Diminished BS on both lower lung zones. (-) wheezing, crackles, rhonchi (-) egophony  Cardiovascular: Regular rate and  rhythm, heart sounds normal, no murmur or gallops, no peripheral edema  Gastrointestinal:  Normal bowel sounds. Soft, non-tender. No hepatosplenomegaly.  (-) masses.    Musculoskeletal:  Normal muscle tone. Normal gait.   Extremities: Grossly normal. (-) clubbing, cyanosis.  (-) edema  Skin: (-) rash,lesions seen.   Neurological/Psychiatric : alert, oriented to time, place, person. Normal mood and affect          Assessment & Plan:  OSA (obstructive sleep apnea) Patient lives by herself. Has snoring, witnessed apneas, occasional gasping and choking, has frequent awakenings at night.  She ends up sleeping 6 hrs/night. Patient has unrefreshed sleep. She has hypersomnia. Hypersomnia affects her functionality. 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  Patient is on Dilaudid pump because of her chronic pain. She is also on Abilify and Adderall for mental health issues. Initially, Adderall was given for her cognitive impairment. She continues to take  Adderall to keep her awake.   A split-night study on March 03, 2016 with an AHI of 23. No optimal settings were obtained on CPAP. At CPAP starting at 12 cm water, she started having central apneas. At CPAP 11 cm water, her AHI was 9.2 but she only had  6.5 minutes of sleep. Her PLMSI  was elevated at 96.   Plan:  We extensively discussed the diagnosis, pathophysiology, and treatment options for Obstructive Sleep Apnea (OSA).  We discussed treatment options for OSA including CPAP, BiPaP, as well as surgical options and oral devices.   We will start patient on autocpap 5-11 cm water. Based on the sleep study, she seemed adequate on CPAP 11 cm water. Starting at CPAP 12 cm water, she started having central events. No BiPAP titration was done. She will need good compliance. If her AHI is not better on auto CPAP, she will need a repeat titration study, this time with BiPAP. 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 Hopefully, we can wean off Adderall once she is corrected. Currently she is on Dilaudid pump as well as Abilify.  Patient was instructed to call  the office if he/she has not received the PAP device in 1-2 weeks.  Patient was instructed to have mask, tubings, filter, reservoir cleaned at least once a week with soapy water.  Patient was instructed to call the office if he/she is having issues with the PAP device.    I advised patient to obtain sufficient amount of sleep --  7 to 8 hours at least in a 24 hr period.  Patient was advised to follow good sleep hygiene.  Patient was advised NOT to engage in activities requiring concentration and/or vigilance if he/she is and  sleepy.  Patient is NOT to drive if he/she is sleepy.      COPD (chronic obstructive pulmonary disease) Patient has 65-pack-year smoking history, smokes 1-1/2 packs per day. She likely has moderate COPD. She states her COPD has been stable. Her primary care doctor takes care for COPD. I encouraged her to quit smoking. She is on Advair 250/50 one puff twice a day. I mentioned about Spiriva if she gtes more SOB.  Advised her on PNA and influenza vaccine.   Tobacco user counselled on smoking cessation.      Thank you very much for letting me participate in this patient's care. Please do not hesitate to give me a call if you have any questions or concerns regarding the treatment plan.   Patient will follow up with me in 6-8 weeks.     Pollie MeyerJ. Angelo A. de Dios, MD 03/19/2016   11:59 AM Pulmonary and Critical Care Medicine  HealthCare Pager: [phone] Office: [phone], Fax: [phone]

## 2016-03-19 NOTE — Assessment & Plan Note (Signed)
Patient has 65-pack-year smoking history, smokes 1-1/2 packs per day. She likely has moderate COPD. She states her COPD has been stable. Her primary care doctor takes care for COPD. I encouraged her to quit smoking. She is on Advair 250/50 one puff twice a day. I mentioned about Spiriva if she gtes more SOB.  Advised her on PNA and influenza vaccine.

## 2016-03-19 NOTE — Assessment & Plan Note (Signed)
Patient lives by herself. Has snoring, witnessed apneas, occasional gasping and choking, has frequent awakenings at night.  She ends up sleeping 6 hrs/night. Patient has unrefreshed sleep. She has hypersomnia. Hypersomnia affects her functionality. 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  Patient is on Dilaudid pump because of her chronic pain. She is also on Abilify and Adderall for mental health issues. Initially, Adderall was given for her cognitive impairment. She continues to take Adderall to keep her awake.   A split-night study on March 03, 2016 with an AHI of 23. No optimal settings were obtained on CPAP. At CPAP starting at 12 cm water, she started having central apneas. At CPAP 11 cm water, her AHI was 9.2 but she only had  6.5 minutes of sleep. Her PLMSI  was elevated at 96.   Plan:  We extensively discussed the diagnosis, pathophysiology, and treatment options for Obstructive Sleep Apnea (OSA).  We discussed treatment options for OSA including CPAP, BiPaP, as well as surgical options and oral devices.   We will start patient on autocpap 5-11 cm water. Based on the sleep study, she seemed adequate on CPAP 11 cm water. Starting at CPAP 12 cm water, she started having central events. No BiPAP titration was done. She will need good compliance. If her AHI is not better on auto CPAP, she will need a repeat titration study, this time with BiPAP. 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 Hopefully, we can wean off Adderall once she is corrected. Currently she is on Dilaudid pump as well as Abilify.  Patient was instructed to call the office if he/she has not received the PAP device in 1-2 weeks.  Patient was instructed to have mask, tubings, filter, reservoir cleaned at least once a week with soapy water.  Patient was instructed to call the office if he/she is having issues with the PAP device.    I advised patient to obtain sufficient  amount of sleep --  7 to 8 hours at least in a 24 hr period.  Patient was advised to follow good sleep hygiene.  Patient was advised NOT to engage in activities requiring concentration and/or vigilance if he/she is and  sleepy.  Patient is NOT to drive if he/she is sleepy.

## 2016-03-19 NOTE — Assessment & Plan Note (Signed)
counselled on smoking cessation.  

## 2016-03-19 NOTE — Patient Instructions (Signed)
  It was a pleasure taking care of you today!  You are diagnosed with Obstructive Sleep Apnea or OSA.  You stop breathing  23 times/hr.   We will order you a autocpap  Machine, 5-11 cm water.  Please call the office if you do NOT receive your machine in the next 1-2 weeks.   Please make sure you use your CPAP device everytime you sleep.  We will monitor the usage of your machine per your insurance requirement.  Your insurance company may take the machine from you if you are not using it regularly.   Please clean the mask, tubings, filter, water reservoir with soapy water every week.  Please use distilled water for the water reservoir.   Please call the office or your machine provider (DME company) if you are having issues with the device.    Return to clinic in 4-6 weeks.

## 2016-03-20 DIAGNOSIS — M5417 Radiculopathy, lumbosacral region: Secondary | ICD-10-CM | POA: Diagnosis not present

## 2016-03-20 DIAGNOSIS — G894 Chronic pain syndrome: Secondary | ICD-10-CM | POA: Diagnosis not present

## 2016-03-20 DIAGNOSIS — M545 Low back pain: Secondary | ICD-10-CM | POA: Diagnosis not present

## 2016-03-20 DIAGNOSIS — G8929 Other chronic pain: Secondary | ICD-10-CM | POA: Diagnosis not present

## 2016-03-27 DIAGNOSIS — F332 Major depressive disorder, recurrent severe without psychotic features: Secondary | ICD-10-CM | POA: Diagnosis not present

## 2016-04-03 ENCOUNTER — Ambulatory Visit: Payer: Medicare Other | Admitting: Family Medicine

## 2016-04-17 ENCOUNTER — Ambulatory Visit: Payer: Medicare Other | Admitting: Adult Health

## 2016-04-20 ENCOUNTER — Encounter: Payer: Self-pay | Admitting: Family Medicine

## 2016-05-12 DIAGNOSIS — F332 Major depressive disorder, recurrent severe without psychotic features: Secondary | ICD-10-CM | POA: Diagnosis not present

## 2016-05-13 ENCOUNTER — Other Ambulatory Visit: Payer: Self-pay | Admitting: Family Medicine

## 2016-06-02 ENCOUNTER — Telehealth: Payer: Self-pay

## 2016-06-02 NOTE — Telephone Encounter (Signed)
LMOM TCB x1  Tried to obtain a 1 month download from pt. CPAP in airview but it seems like it is not picking up anything from her machine. Called pt. To find out if she has been using her machine.

## 2016-06-11 ENCOUNTER — Telehealth: Payer: Self-pay | Admitting: Pulmonary Disease

## 2016-06-11 NOTE — Telephone Encounter (Signed)
LMOMTCB x 1 

## 2016-06-11 NOTE — Telephone Encounter (Signed)
   DL the last 2 mos : 0%, AHI 2.1 on autocpap 5-11 cm water.  Jasmine, pls call and tell pt she need to use cpap.  Pollie MeyerJ. Angelo A de Dios, MD 06/11/2016, 1:58 AM [place] [facility] Pager [phone] After 3 pm or if no answer, call [phone]

## 2016-06-13 ENCOUNTER — Telehealth: Payer: Self-pay | Admitting: Pulmonary Disease

## 2016-06-13 NOTE — Telephone Encounter (Signed)
AD  Please advise  Pt. Returned call and stated she is having trouble with the pressure of the air on her CPAP, states she feels like she is not getting enough air.   [address] Water StreetJose Angelo Dorcas McmurrayA de Dios, MD      06/11/16 1:57 AM  Note      DL the last 2 mos : 0%, AHI 2.1 on autocpap 5-11 cm water.  Jasmine, pls call and tell pt she need to use cpap.  Pollie MeyerJ. Angelo A de Dios, MD 06/11/2016, 1:58 AM [place] [facility] Pager [phone] After 3 pm or if no answer, call [phone]

## 2016-06-15 ENCOUNTER — Other Ambulatory Visit: Payer: Self-pay | Admitting: Family Medicine

## 2016-06-16 NOTE — Telephone Encounter (Signed)
   Please call patient's DME and ask whether we can increase her settings to autocpap 5-15 cm water. Please get a download the next month with these settings. Thank you  J. Alexis FrockAngelo A de Dios, MD 06/16/2016, 4:46 PM [place] [facility] Pager [phone] After 3 pm or if no answer, call [phone]

## 2016-06-17 NOTE — Telephone Encounter (Signed)
lmtcb x1 for pt. 

## 2016-06-19 ENCOUNTER — Other Ambulatory Visit: Payer: Self-pay

## 2016-06-19 DIAGNOSIS — G4733 Obstructive sleep apnea (adult) (pediatric): Secondary | ICD-10-CM

## 2016-06-19 NOTE — Telephone Encounter (Signed)
Order placed for new settings. Nothing further is needed at this time.

## 2016-06-19 NOTE — Telephone Encounter (Signed)
This is her original settings  Louann SjogrenJose Angelo A de Dios, MD      06/11/16 1:57 AM  Note      DL the last 2 mos : 0%, AHI 2.1 on autocpap 5-11 cm water.  Jasmine, pls call and tell pt she need to use cpap.  Pollie MeyerJ. Angelo A de Dios, MD 06/11/2016, 1:58 AM [place] [facility] Pager [phone] After 3 pm or if no answer, call [phone]

## 2016-06-19 NOTE — Telephone Encounter (Signed)
AD Please advise on patient's CPAP pressure settings. Thanks.

## 2016-06-20 DIAGNOSIS — G894 Chronic pain syndrome: Secondary | ICD-10-CM | POA: Diagnosis not present

## 2016-06-20 DIAGNOSIS — M5417 Radiculopathy, lumbosacral region: Secondary | ICD-10-CM | POA: Diagnosis not present

## 2016-06-20 DIAGNOSIS — M545 Low back pain: Secondary | ICD-10-CM | POA: Diagnosis not present

## 2016-06-26 ENCOUNTER — Other Ambulatory Visit: Payer: Self-pay | Admitting: Family Medicine

## 2016-07-21 ENCOUNTER — Other Ambulatory Visit: Payer: Self-pay | Admitting: Family Medicine

## 2016-08-18 ENCOUNTER — Telehealth: Payer: Self-pay | Admitting: Pulmonary Disease

## 2016-08-18 NOTE — Telephone Encounter (Signed)
Spoke with pt. States that she was never contacted about having her CPAP settings changed from back in December. Per our documentation, the order was sent to [facility] in December. Called [facility] and spoke with [place]Brianna. Colin MuldersBrianna states that this was taken care of remotely, they don't normally contact the pt when the settings are changed remotely. Called the pt and made her aware of that. She was appreciative and states that she will contact us if she has any further problems.

## 2016-09-01 DIAGNOSIS — F332 Major depressive disorder, recurrent severe without psychotic features: Secondary | ICD-10-CM | POA: Diagnosis not present

## 2016-09-05 ENCOUNTER — Ambulatory Visit (INDEPENDENT_AMBULATORY_CARE_PROVIDER_SITE_OTHER): Payer: Medicare Other | Admitting: Family Medicine

## 2016-09-05 VITALS — BP 120/80 | HR 105 | Ht 67.0 in | Wt 218.8 lb

## 2016-09-05 DIAGNOSIS — E039 Hypothyroidism, unspecified: Secondary | ICD-10-CM

## 2016-09-05 DIAGNOSIS — R229 Localized swelling, mass and lump, unspecified: Secondary | ICD-10-CM

## 2016-09-05 DIAGNOSIS — S31109A Unspecified open wound of abdominal wall, unspecified quadrant without penetration into peritoneal cavity, initial encounter: Secondary | ICD-10-CM | POA: Diagnosis not present

## 2016-09-05 LAB — TSH: TSH: 8.07 mIU/L — ABNORMAL HIGH

## 2016-09-05 NOTE — Progress Notes (Signed)
Subjective:     Patient ID: Cynthia MinionKristine L [name], female   DOB: 02-Feb-1956, 61 y.o.   MRN: [id number]  HPI Patient seen for several items as follows  Nodular lesion dorsum right second toe which she first noted couple months ago. This has been growing fairly rapidly. Nonpainful.  Patient has abdominal pain, and noticed over month ago with ulceration of the skin in that region. Denies any recent injury. No drainage. No erythema. She has no history of diabetes.  She's had gradual weight gain over the past couple years. She takes low-dose levothyroxine for hypothyroidism and is overdue for labs. Last TSH was over 7.  Past Medical History:  Diagnosis Date  . Bipolar disorder (HCC)   . COPD (chronic obstructive pulmonary disease) (HCC)   . Depression   . GERD (gastroesophageal reflux disease)    Past Surgical History:  Procedure Laterality Date  . APPENDECTOMY    . CHOLECYSTECTOMY    . LUMBAR LAMINECTOMY     6 surgeries    reports that she has been smoking Cigarettes.  She does not have any smokeless tobacco history on file. Her alcohol and drug histories are not on file. family history includes Alzheimer's disease in her mother; Colon cancer in her father. Allergies  Allergen Reactions  . Codeine Phosphate      Review of Systems  Constitutional: Positive for fatigue and unexpected weight change.  Eyes: Negative for visual disturbance.  Respiratory: Negative for cough, chest tightness, shortness of breath and wheezing.   Cardiovascular: Negative for chest pain, palpitations and leg swelling.  Neurological: Negative for dizziness, seizures, syncope, weakness, light-headedness and headaches.       Objective:   Physical Exam  Constitutional: She appears well-developed and well-nourished.  Cardiovascular: Normal rate and regular rhythm.   Pulmonary/Chest: Effort normal and breath sounds normal. No respiratory distress. She has no wheezes. She has no rales.  Skin:  Patient has  firm nodular lesion dorsum right second toe with some hyperkeratosis and slightly umbilicated center  Right lower abdominal wall she has 2 x 1 cm superficial wound with no signs of secondary infection. No erythema. No drainage. No necrosis. No foul odor.       Assessment:     #1 hypothyroidism  #2 nodular skin lesion right second toe. Needs further evaluation-rule out keratoacanthoma versus squamous cell skin cancer versus other  #3 superficial skin wound right lower abdominal wall without signs of secondary infection    Plan:     -Check TSH -Recommend podiatry referral regarding right second toe lesion -Recommend saline wet to dry dressing changes daily and reassess wound in 2 weeks and sooner for signs of secondary infection.  Cynthia CoveyBruce W Burchette MD [facility]

## 2016-09-05 NOTE — Patient Instructions (Signed)
-  Setup podiatry appointment as discussed -We will call you regarding lab work done today -Recommend daily saline wet to dry dressings as discussed -Follow-up promptly for signs of secondary infection such as redness, drainage, warmth, or swelling -Schedule follow-up for wound check and approximately 2 weeks

## 2016-09-05 NOTE — Progress Notes (Signed)
Pre visit review using our clinic review tool, if applicable. No additional management support is needed unless otherwise documented below in the visit note. 

## 2016-09-08 MED ORDER — LEVOTHYROXINE SODIUM 50 MCG PO TABS: 50.0000 ug | ORAL_TABLET | Freq: Every day | ORAL | 1 refills | Status: DC

## 2016-09-08 NOTE — Addendum Note (Signed)
Addended by: Johnella MoloneyFUNDERBURK, JO A on: 09/08/2016 04:30 PM   Modules accepted: Orders

## 2016-09-15 ENCOUNTER — Ambulatory Visit: Payer: Self-pay | Admitting: Podiatry

## 2016-09-16 DIAGNOSIS — M545 Low back pain: Secondary | ICD-10-CM | POA: Diagnosis not present

## 2016-09-16 DIAGNOSIS — G8929 Other chronic pain: Secondary | ICD-10-CM | POA: Diagnosis not present

## 2016-09-16 DIAGNOSIS — G894 Chronic pain syndrome: Secondary | ICD-10-CM | POA: Diagnosis not present

## 2016-09-16 DIAGNOSIS — M5417 Radiculopathy, lumbosacral region: Secondary | ICD-10-CM | POA: Diagnosis not present

## 2016-09-19 ENCOUNTER — Ambulatory Visit: Payer: Medicare Other | Admitting: Family Medicine

## 2016-09-22 ENCOUNTER — Ambulatory Visit: Payer: Self-pay | Admitting: Podiatry

## 2016-09-29 ENCOUNTER — Ambulatory Visit: Payer: Self-pay | Admitting: Podiatry

## 2016-10-17 DIAGNOSIS — F332 Major depressive disorder, recurrent severe without psychotic features: Secondary | ICD-10-CM | POA: Diagnosis not present

## 2016-10-22 ENCOUNTER — Other Ambulatory Visit: Payer: Self-pay | Admitting: Family Medicine

## 2016-10-22 ENCOUNTER — Other Ambulatory Visit: Payer: Self-pay | Admitting: *Deleted

## 2016-10-27 ENCOUNTER — Telehealth: Payer: Self-pay | Admitting: Pulmonary Disease

## 2016-10-27 NOTE — Telephone Encounter (Signed)
Dr. Christene Slates,   Per [facility], Spiriva is no longer covered. Alternative includes Incruse.   Is it ok to switch medications for patient? If so, [facility] [place] on [address]. Thanks!

## 2016-10-28 MED ORDER — UMECLIDINIUM BROMIDE 62.5 MCG/INH IN AEPB: 1.0000 | INHALATION_SPRAY | Freq: Every day | RESPIRATORY_TRACT | 6 refills | Status: DC

## 2016-10-28 NOTE — Telephone Encounter (Signed)
Spoke with opt and made her aware of the change in her inhaler. Pt understood and agreed to switch to the Incruse. An rx was sent to her pharmacy of choice. Nothing further is needed

## 2016-10-28 NOTE — Telephone Encounter (Signed)
Yes, pls switch to incruse, 1 puff daily.  pls let pt know.  Thanks.   Pollie Meyer, MD 10/28/2016, 12:21 AM  Pulmonary and Critical Care Pager [phone] After 3 pm or if no answer, call [phone]

## 2016-11-27 DIAGNOSIS — Z79899 Other long term (current) drug therapy: Secondary | ICD-10-CM | POA: Diagnosis not present

## 2016-11-27 DIAGNOSIS — Z885 Allergy status to narcotic agent status: Secondary | ICD-10-CM | POA: Diagnosis not present

## 2016-11-27 DIAGNOSIS — F17200 Nicotine dependence, unspecified, uncomplicated: Secondary | ICD-10-CM | POA: Diagnosis not present

## 2016-11-27 DIAGNOSIS — S61211A Laceration without foreign body of left index finger without damage to nail, initial encounter: Secondary | ICD-10-CM | POA: Diagnosis not present

## 2016-11-27 DIAGNOSIS — W269XXA Contact with unspecified sharp object(s), initial encounter: Secondary | ICD-10-CM | POA: Diagnosis not present

## 2016-11-27 DIAGNOSIS — F329 Major depressive disorder, single episode, unspecified: Secondary | ICD-10-CM | POA: Diagnosis not present

## 2016-12-02 DIAGNOSIS — M5417 Radiculopathy, lumbosacral region: Secondary | ICD-10-CM | POA: Diagnosis not present

## 2016-12-02 DIAGNOSIS — M545 Low back pain: Secondary | ICD-10-CM | POA: Diagnosis not present

## 2016-12-02 DIAGNOSIS — G894 Chronic pain syndrome: Secondary | ICD-10-CM | POA: Diagnosis not present

## 2016-12-02 DIAGNOSIS — Z9689 Presence of other specified functional implants: Secondary | ICD-10-CM | POA: Diagnosis not present

## 2016-12-03 ENCOUNTER — Telehealth: Payer: Self-pay | Admitting: Family Medicine

## 2016-12-03 DIAGNOSIS — E039 Hypothyroidism, unspecified: Secondary | ICD-10-CM

## 2016-12-03 NOTE — Telephone Encounter (Signed)
Ok

## 2016-12-03 NOTE — Telephone Encounter (Signed)
Order placed

## 2016-12-03 NOTE — Telephone Encounter (Signed)
Pt would like to have her TSH checked may I have a order for it?

## 2016-12-04 ENCOUNTER — Other Ambulatory Visit: Payer: Medicare Other

## 2016-12-04 NOTE — Telephone Encounter (Signed)
Pt is scheduled °

## 2016-12-09 ENCOUNTER — Other Ambulatory Visit: Payer: Medicare Other

## 2016-12-09 ENCOUNTER — Other Ambulatory Visit: Payer: Self-pay | Admitting: Family Medicine

## 2016-12-15 DIAGNOSIS — G894 Chronic pain syndrome: Secondary | ICD-10-CM | POA: Diagnosis not present

## 2016-12-15 DIAGNOSIS — M5417 Radiculopathy, lumbosacral region: Secondary | ICD-10-CM | POA: Diagnosis not present

## 2016-12-22 DIAGNOSIS — F332 Major depressive disorder, recurrent severe without psychotic features: Secondary | ICD-10-CM | POA: Diagnosis not present

## 2017-01-05 DIAGNOSIS — M533 Sacrococcygeal disorders, not elsewhere classified: Secondary | ICD-10-CM | POA: Diagnosis not present

## 2017-01-05 DIAGNOSIS — M542 Cervicalgia: Secondary | ICD-10-CM | POA: Diagnosis not present

## 2017-01-05 DIAGNOSIS — M5417 Radiculopathy, lumbosacral region: Secondary | ICD-10-CM | POA: Diagnosis not present

## 2017-01-05 DIAGNOSIS — G894 Chronic pain syndrome: Secondary | ICD-10-CM | POA: Diagnosis not present

## 2017-01-09 ENCOUNTER — Other Ambulatory Visit: Payer: Self-pay | Admitting: Family Medicine

## 2017-02-19 ENCOUNTER — Other Ambulatory Visit: Payer: Self-pay | Admitting: Family Medicine

## 2017-03-19 DIAGNOSIS — M5417 Radiculopathy, lumbosacral region: Secondary | ICD-10-CM | POA: Diagnosis not present

## 2017-03-19 DIAGNOSIS — M533 Sacrococcygeal disorders, not elsewhere classified: Secondary | ICD-10-CM | POA: Diagnosis not present

## 2017-03-19 DIAGNOSIS — M545 Low back pain: Secondary | ICD-10-CM | POA: Diagnosis not present

## 2017-03-19 DIAGNOSIS — G894 Chronic pain syndrome: Secondary | ICD-10-CM | POA: Diagnosis not present

## 2017-04-02 ENCOUNTER — Encounter: Payer: Self-pay | Admitting: Family Medicine

## 2017-04-20 ENCOUNTER — Other Ambulatory Visit: Payer: Self-pay | Admitting: Family Medicine

## 2017-05-04 ENCOUNTER — Telehealth: Payer: Self-pay

## 2017-05-04 ENCOUNTER — Other Ambulatory Visit: Payer: Self-pay

## 2017-05-04 MED ORDER — ALBUTEROL SULFATE HFA 108 (90 BASE) MCG/ACT IN AERS: INHALATION_SPRAY | RESPIRATORY_TRACT | 3 refills | Status: DC

## 2017-05-04 NOTE — Telephone Encounter (Signed)
Spoke with pt and advised. Rx sent to pharmacy. She will contact pulmonary for follow up. Nothing further needed at this time.

## 2017-05-04 NOTE — Telephone Encounter (Signed)
Pt called for refill on Ventolin. She states that she is taking Incruse and Adviar. When I enquired about her following up with Pulmonary she states "they dismissed me because they couldn't help me". I see no notes to that effect in the chart. LOV note 03/19/16 with Pulmonary asked for a 6-8 week follow up.   Dr. Caryl NeverBurchette - Please advise. Thanks!

## 2017-05-04 NOTE — Telephone Encounter (Signed)
We could give her one refill of the Ventolin- but I definitely recommend follow up with pulmonary.

## 2017-05-19 DIAGNOSIS — F332 Major depressive disorder, recurrent severe without psychotic features: Secondary | ICD-10-CM | POA: Diagnosis not present

## 2017-06-02 ENCOUNTER — Ambulatory Visit: Payer: Medicare Other | Admitting: Family Medicine

## 2017-06-09 ENCOUNTER — Ambulatory Visit: Payer: Medicare Other | Admitting: Family Medicine

## 2017-06-11 DIAGNOSIS — Z9689 Presence of other specified functional implants: Secondary | ICD-10-CM | POA: Diagnosis not present

## 2017-06-11 DIAGNOSIS — G894 Chronic pain syndrome: Secondary | ICD-10-CM | POA: Diagnosis not present

## 2017-06-11 DIAGNOSIS — G8929 Other chronic pain: Secondary | ICD-10-CM | POA: Diagnosis not present

## 2017-06-11 DIAGNOSIS — M533 Sacrococcygeal disorders, not elsewhere classified: Secondary | ICD-10-CM | POA: Diagnosis not present

## 2017-06-17 ENCOUNTER — Emergency Department (HOSPITAL_COMMUNITY): Payer: Medicare Other

## 2017-06-17 ENCOUNTER — Encounter (HOSPITAL_COMMUNITY): Payer: Self-pay | Admitting: Nurse Practitioner

## 2017-06-17 ENCOUNTER — Other Ambulatory Visit: Payer: Self-pay

## 2017-06-17 ENCOUNTER — Emergency Department (HOSPITAL_COMMUNITY)
Admission: EM | Admit: 2017-06-17 | Discharge: 2017-06-17 | Disposition: A | Discharge status: Home / Self Care | Payer: Medicare Other | Attending: Emergency Medicine | Admitting: Emergency Medicine

## 2017-06-17 ENCOUNTER — Ambulatory Visit: Payer: Self-pay | Admitting: *Deleted

## 2017-06-17 DIAGNOSIS — Z5321 Procedure and treatment not carried out due to patient leaving prior to being seen by health care provider: Secondary | ICD-10-CM | POA: Insufficient documentation

## 2017-06-17 DIAGNOSIS — R000 Tachycardia, unspecified: Secondary | ICD-10-CM | POA: Insufficient documentation

## 2017-06-17 LAB — BASIC METABOLIC PANEL
ANION GAP: 8 (ref 5–15)
BUN: 10 mg/dL (ref 6–20)
CALCIUM: 9.2 mg/dL (ref 8.9–10.3)
CO2: 28 mmol/L (ref 22–32)
Chloride: 102 mmol/L (ref 101–111)
Creatinine, Ser: 0.63 mg/dL (ref 0.44–1.00)
Glucose, Bld: 133 mg/dL — ABNORMAL HIGH (ref 65–99)
POTASSIUM: 2.8 mmol/L — AB (ref 3.5–5.1)
Sodium: 138 mmol/L (ref 135–145)

## 2017-06-17 LAB — CBC
HEMATOCRIT: 35.4 % — AB (ref 36.0–46.0)
HEMOGLOBIN: 12 g/dL (ref 12.0–15.0)
MCH: 27.8 pg (ref 26.0–34.0)
MCHC: 33.9 g/dL (ref 30.0–36.0)
MCV: 81.9 fL (ref 78.0–100.0)
Platelets: 341 10*3/uL (ref 150–400)
RBC: 4.32 MIL/uL (ref 3.87–5.11)
RDW: 14 % (ref 11.5–15.5)
WBC: 9 10*3/uL (ref 4.0–10.5)

## 2017-06-17 NOTE — Telephone Encounter (Signed)
  Reason for Disposition . Age > 60 years (Exception: brief heart beat symptoms that went away and now feels well)  Answer Assessment - Initial Assessment Questions 1. DESCRIPTION: "Please describe your heart rate or heart beat that you are having" (e.g., fast/slow, regular/irregular, skipped or extra beats, "palpitations")     Rapid, pounding, regular 2. ONSET: "When did it start?" (Minutes, hours or days)      Started 10:00 3. DURATION: "How long does it last" (e.g., seconds, minutes, hours)     constant 4. PATTERN "Does it come and go, or has it been constant since it started?"  "Does it get worse with exertion?"   "Are you feeling it now?"     Gets worse if patient is up, patient states she feels normal 5. TAP: "Using your hand, can you tap out what you are feeling on a chair or table in front of you, so that I can hear?" (Note: not all patients can do this)       regular 6. HEART RATE: "Can you tell me your heart rate?" "How many beats in 15 seconds?"  (Note: not all patients can do this)       33 7. RECURRENT SYMPTOM: "Have you ever had this before?" If so, ask: "When was the last time?" and "What happened that time?"      no 8. CAUSE: "What do you think is causing the palpitations?"     Patient has been sick- and she has gone back to work 9. CARDIAC HISTORY: "Do you have any history of heart disease?" (e.g., heart attack, angina, bypass surgery, angioplasty, arrhythmia)      No- patient does smoke 10. OTHER SYMPTOMS: "Do you have any other symptoms?" (e.g., dizziness, chest pain, sweating, difficulty breathing)       Breathing is more rapid 11. PREGNANCY: "Is there any chance you are pregnant?" "When was your last menstrual period?"       n/a  Protocols used: HEART RATE AND HEARTBEAT QUESTIONS-A-AH

## 2017-06-17 NOTE — ED Notes (Signed)
Pt returned her stickers to registration. She states, "I'm fine now."

## 2017-06-17 NOTE — Telephone Encounter (Signed)
I confirmed patient is in ER now.

## 2017-06-17 NOTE — ED Triage Notes (Signed)
Pt presents with c/o feeling like heart is beating fast. Reports started feeling this way around 10am this morning while she was sitting and reading a book. States it felt like she had been doing a lot of movement but actually had not. States she had been sitting and reading for about 1112m-1hr. Denies any dizziness, lightheadedness, shortness of breath, or chest pain. Patient states she had a stomach virus for the past 4 days but began feeling better yesterday. Denies any cardiac history and only COPD as far as respiratory hx which is well managed.

## 2017-07-29 ENCOUNTER — Other Ambulatory Visit: Payer: Self-pay | Admitting: *Deleted

## 2017-07-29 ENCOUNTER — Telehealth: Payer: Self-pay | Admitting: Family Medicine

## 2017-07-29 MED ORDER — LEVOTHYROXINE SODIUM 50 MCG PO TABS: ORAL_TABLET | ORAL | 0 refills | Status: DC

## 2017-07-29 NOTE — Telephone Encounter (Signed)
Copied from CRM [id number]. Topic: Quick Communication - Rx Refill/Question >> Jul 29, 2017  3:21 PM Oneal GroutSebastian, Jennifer S wrote: Medication: levothyroxine (SYNTHROID, LEVOTHROID) 50 MCG tablet   Has the patient contacted their pharmacy? Yes.     (Agent: If no, request that the patient contact the pharmacy for the refill.)   Preferred Pharmacy (with phone number or street name): [facility]   Agent: Please be advised that RX refills may take up to 3 business days. We ask that you follow-up with your pharmacy.

## 2017-07-29 NOTE — Telephone Encounter (Signed)
LR: 04/20/17 #90  TSH 8.07 on 09/05/16 Routing back to provider

## 2017-07-31 ENCOUNTER — Other Ambulatory Visit: Payer: Self-pay | Admitting: *Deleted

## 2017-07-31 MED ORDER — LEVOTHYROXINE SODIUM 50 MCG PO TABS: ORAL_TABLET | ORAL | 0 refills | Status: DC

## 2017-07-31 MED ORDER — FESOTERODINE FUMARATE ER 4 MG PO TB24: 4.0000 mg | ORAL_TABLET | Freq: Every day | ORAL | 0 refills | Status: DC

## 2017-07-31 NOTE — Telephone Encounter (Signed)
Rx sent 

## 2017-08-20 DIAGNOSIS — F332 Major depressive disorder, recurrent severe without psychotic features: Secondary | ICD-10-CM | POA: Diagnosis not present

## 2017-08-25 ENCOUNTER — Ambulatory Visit: Payer: Medicare Other | Admitting: Family Medicine

## 2017-08-26 ENCOUNTER — Ambulatory Visit: Payer: Medicare Other | Admitting: Family Medicine

## 2017-08-26 DIAGNOSIS — Z0289 Encounter for other administrative examinations: Secondary | ICD-10-CM

## 2017-08-27 DIAGNOSIS — M5417 Radiculopathy, lumbosacral region: Secondary | ICD-10-CM | POA: Diagnosis not present

## 2017-08-27 DIAGNOSIS — M545 Low back pain: Secondary | ICD-10-CM | POA: Diagnosis not present

## 2017-08-27 DIAGNOSIS — M533 Sacrococcygeal disorders, not elsewhere classified: Secondary | ICD-10-CM | POA: Diagnosis not present

## 2017-08-27 DIAGNOSIS — G894 Chronic pain syndrome: Secondary | ICD-10-CM | POA: Diagnosis not present

## 2017-08-27 DIAGNOSIS — Z9689 Presence of other specified functional implants: Secondary | ICD-10-CM | POA: Diagnosis not present

## 2017-09-10 ENCOUNTER — Other Ambulatory Visit: Payer: Self-pay | Admitting: Family Medicine

## 2017-09-23 ENCOUNTER — Ambulatory Visit: Payer: Medicare Other | Admitting: Family Medicine

## 2017-09-23 DIAGNOSIS — Z0289 Encounter for other administrative examinations: Secondary | ICD-10-CM

## 2017-10-12 ENCOUNTER — Other Ambulatory Visit: Payer: Self-pay | Admitting: Family Medicine

## 2017-10-13 ENCOUNTER — Other Ambulatory Visit: Payer: Self-pay | Admitting: Family Medicine

## 2017-10-15 ENCOUNTER — Telehealth: Payer: Self-pay | Admitting: Acute Care

## 2017-10-15 NOTE — Telephone Encounter (Signed)
ATC pt, no answer. Left message for pt to call back.  Pt needs an appoint because she has not been seen since 2017 with [facility]. Will await call back to schedule an appointment.

## 2017-10-19 NOTE — Telephone Encounter (Signed)
Attempted to call patient, no answer, message left to call back to schedule appointment.

## 2017-10-20 NOTE — Telephone Encounter (Signed)
ATC pt, no answer. Left message for pt to call back.  Third attempt to reach pt, will close encounter due to office protocal.

## 2017-11-08 ENCOUNTER — Emergency Department (HOSPITAL_COMMUNITY)
Admission: EM | Admit: 2017-11-08 | Discharge: 2017-11-08 | Disposition: A | Discharge status: Home / Self Care | Payer: Medicare Other | Attending: Emergency Medicine | Admitting: Emergency Medicine

## 2017-11-08 ENCOUNTER — Encounter (HOSPITAL_COMMUNITY): Payer: Self-pay

## 2017-11-08 ENCOUNTER — Other Ambulatory Visit: Payer: Self-pay

## 2017-11-08 DIAGNOSIS — Z5321 Procedure and treatment not carried out due to patient leaving prior to being seen by health care provider: Secondary | ICD-10-CM | POA: Insufficient documentation

## 2017-11-08 DIAGNOSIS — R251 Tremor, unspecified: Secondary | ICD-10-CM | POA: Diagnosis not present

## 2017-11-08 NOTE — ED Notes (Addendum)
After reporting to the pt the doctor said she will be in shortly, pt has already gotten dressed and states she appreciates everything but really needs to leave. This nurse explained the risks of not waiting to see and doctor and attempted to encourage her to stay. Pt states she will sign AMA that she understands the risks and wants to wait to see her primary but will come back if things worsen.

## 2017-11-08 NOTE — ED Notes (Signed)
Pt came to nurses station stating she thinks she needs to go home now, that she has a dog she is worried about taking care of. States that her symptoms have improved while waiting. This nurse encouraged pt to wait while she spoke with a doctor.

## 2017-11-08 NOTE — ED Triage Notes (Signed)
Pt reports involuntary jerking and teeth clenching starting this morning. She reports that she takes effexor, abilify, hydroxizine, and toviaz. States this has never happened before. She is A&Ox4. Ambulatory, and denies pain.

## 2017-11-17 DIAGNOSIS — M533 Sacrococcygeal disorders, not elsewhere classified: Secondary | ICD-10-CM | POA: Diagnosis not present

## 2017-11-17 DIAGNOSIS — M5417 Radiculopathy, lumbosacral region: Secondary | ICD-10-CM | POA: Diagnosis not present

## 2017-11-17 DIAGNOSIS — M5441 Lumbago with sciatica, right side: Secondary | ICD-10-CM | POA: Diagnosis not present

## 2017-11-17 DIAGNOSIS — M5442 Lumbago with sciatica, left side: Secondary | ICD-10-CM | POA: Diagnosis not present

## 2017-11-17 DIAGNOSIS — Z9689 Presence of other specified functional implants: Secondary | ICD-10-CM | POA: Diagnosis not present

## 2017-11-23 ENCOUNTER — Encounter: Payer: Self-pay | Admitting: Family Medicine

## 2017-11-23 ENCOUNTER — Other Ambulatory Visit: Payer: Self-pay | Admitting: Family Medicine

## 2017-11-23 MED ORDER — LEVOTHYROXINE SODIUM 50 MCG PO TABS: ORAL_TABLET | ORAL | 0 refills | Status: DC

## 2017-11-23 MED ORDER — FESOTERODINE FUMARATE ER 4 MG PO TB24: 4.0000 mg | ORAL_TABLET | Freq: Every day | ORAL | 0 refills | Status: DC

## 2017-11-23 MED ORDER — RANITIDINE HCL 150 MG PO TABS: 150.0000 mg | ORAL_TABLET | Freq: Two times a day (BID) | ORAL | 0 refills | Status: DC

## 2017-11-25 ENCOUNTER — Ambulatory Visit (INDEPENDENT_AMBULATORY_CARE_PROVIDER_SITE_OTHER): Payer: Medicare Other | Admitting: Family Medicine

## 2017-11-25 ENCOUNTER — Encounter: Payer: Self-pay | Admitting: Family Medicine

## 2017-11-25 VITALS — BP 110/70 | HR 111 | Temp 98.6°F | Wt 206.7 lb

## 2017-11-25 DIAGNOSIS — E039 Hypothyroidism, unspecified: Secondary | ICD-10-CM

## 2017-11-25 DIAGNOSIS — M2550 Pain in unspecified joint: Secondary | ICD-10-CM

## 2017-11-25 DIAGNOSIS — F17200 Nicotine dependence, unspecified, uncomplicated: Secondary | ICD-10-CM | POA: Diagnosis not present

## 2017-11-25 DIAGNOSIS — E876 Hypokalemia: Secondary | ICD-10-CM | POA: Diagnosis not present

## 2017-11-25 LAB — CBC WITH DIFFERENTIAL/PLATELET
BASOS PCT: 1 % (ref 0.0–3.0)
Basophils Absolute: 0.1 10*3/uL (ref 0.0–0.1)
Eosinophils Absolute: 0.2 10*3/uL (ref 0.0–0.7)
Eosinophils Relative: 2.5 % (ref 0.0–5.0)
HCT: 43.4 % (ref 36.0–46.0)
HEMOGLOBIN: 14.7 g/dL (ref 12.0–15.0)
LYMPHS ABS: 1.7 10*3/uL (ref 0.7–4.0)
Lymphocytes Relative: 26.1 % (ref 12.0–46.0)
MCHC: 33.9 g/dL (ref 30.0–36.0)
MCV: 81.2 fl (ref 78.0–100.0)
MONOS PCT: 6.4 % (ref 3.0–12.0)
Monocytes Absolute: 0.4 10*3/uL (ref 0.1–1.0)
Neutro Abs: 4.1 10*3/uL (ref 1.4–7.7)
Neutrophils Relative %: 64 % (ref 43.0–77.0)
Platelets: 324 10*3/uL (ref 150.0–400.0)
RBC: 5.34 Mil/uL — ABNORMAL HIGH (ref 3.87–5.11)
RDW: 15.2 % (ref 11.5–15.5)
WBC: 6.4 10*3/uL (ref 4.0–10.5)

## 2017-11-25 LAB — BASIC METABOLIC PANEL
BUN: 7 mg/dL (ref 6–23)
CALCIUM: 9.3 mg/dL (ref 8.4–10.5)
CO2: 29 mEq/L (ref 19–32)
CREATININE: 0.79 mg/dL (ref 0.40–1.20)
Chloride: 102 mEq/L (ref 96–112)
GFR: 77.94 mL/min (ref >60.00)
GLUCOSE: 102 mg/dL — AB (ref 70–99)
Potassium: 4.3 mEq/L (ref 3.5–5.1)
SODIUM: 137 meq/L (ref 135–145)

## 2017-11-25 LAB — SEDIMENTATION RATE: Sed Rate: 40 mm/hr — ABNORMAL HIGH (ref 0–30)

## 2017-11-25 LAB — T4, FREE: Free T4: 0.73 ng/dL (ref 0.60–1.60)

## 2017-11-25 LAB — TSH: TSH: 3.67 u[IU]/mL (ref 0.35–4.50)

## 2017-11-25 NOTE — Progress Notes (Signed)
Subjective:     Patient ID: Cynthia [name], female   DOB: [birth year], 62 y.o.   MRN: [id number]  HPI Patient is here with chief complaint of "pain all over ". She's had history of poor compliance with follow-up. Her chronic problems include history of COPD, ongoing nicotine use, obstructive sleep apnea ( intolerant of CPAP), GERD, hypothyroidism, bipolar disorder, history of recurrent depression. She is followed by psychiatrist who is prescribing most of her medications. She is overdue for thyroid follow-up. Her last TSH was over 8. She apparently has been taking her thyroid medication different times of day and also frequently taking along with Zantac. She was instructed today on appropriate administration of levothyroxin  She complains of joint pain multiple joints. She's had some upper back and neck pain and stiffness for over a year. She complains of pains in her hands mostly DIP and PIP joints. Sparing of MCP joints. She has more recently had some pains in her knees and shoulders bilaterally. No recent tick bites. No skin rash. No fevers or chills. No statin use. Some early morning stiffness.  She had ER visit last December in looking over her labs had very low potassium of 2.8 with no documented follow-up since then.  Past Medical History:  Diagnosis Date  . Bipolar disorder (HCC)   . COPD (chronic obstructive pulmonary disease) (HCC)   . Depression   . GERD (gastroesophageal reflux disease)    Past Surgical History:  Procedure Laterality Date  . APPENDECTOMY    . CHOLECYSTECTOMY    . LUMBAR LAMINECTOMY     6 surgeries    reports that she has been smoking cigarettes.  She has never used smokeless tobacco. She reports that she does not drink alcohol or use drugs. family history includes Alzheimer's disease in her mother; Colon cancer in her father. Allergies  Allergen Reactions  . Codeine Phosphate Nausea And Vomiting     Review of Systems  Constitutional: Negative for  fatigue.  Eyes: Negative for visual disturbance.  Respiratory: Negative for cough, chest tightness, shortness of breath and wheezing.   Cardiovascular: Negative for chest pain, palpitations and leg swelling.  Musculoskeletal: Positive for arthralgias and myalgias.  Skin: Negative for rash.  Neurological: Negative for dizziness, seizures, syncope, weakness, light-headedness and headaches.  Hematological: Negative for adenopathy. Does not bruise/bleed easily.       Objective:   Physical Exam  Constitutional: She appears well-developed and well-nourished.  Eyes: Pupils are equal, round, and reactive to light.  Neck: Neck supple. No JVD present. No thyromegaly present.  Cardiovascular: Normal rate and regular rhythm. Exam reveals no gallop.  Pulmonary/Chest: Effort normal and breath sounds normal. No respiratory distress. She has no wheezes. She has no rales.  Musculoskeletal: She exhibits no edema.  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  Skin: No rash noted.       Assessment:     #1 hypothyroidism. Poor compliance with follow-up. Inappropriate administration of medication  #2 patient complains of polyarthralgia's. No objective evidence for inflammatory arthritis. She has hand findings consistent more likely with osteoarthritis.  Doubt PMR.  #3 history of hypokalemia  #4 ongoing nicotine use    Plan:     -Check further labs with basic metabolic panel, TSH, free T4, ANA, CBC, antinuclear antibody, rheumatoid factor, CCP antibody -She is advised to take levothyroxine on empty stomach 30 minutes prior to eating and prior other medications -Discussed smoking  cessation. Handout given -Suggested complete physical but patient declines at this time  Kristian Covey MD [facility]

## 2017-11-25 NOTE — Patient Instructions (Signed)
Coping with Quitting Smoking Quitting smoking is a physical and mental challenge. You will face cravings, withdrawal symptoms, and temptation. Before quitting, work with your health care provider to make a plan that can help you cope. Preparation can help you quit and keep you from giving in. How can I cope with cravings? Cravings usually last for 5-10 minutes. If you get through it, the craving will pass. Consider taking the following actions to help you cope with cravings:  Keep your mouth busy: ? Chew sugar-free gum. ? Suck on hard candies or a straw. ? Brush your teeth.  Keep your hands and body busy: ? Immediately change to a different activity when you feel a craving. ? Squeeze or play with a ball. ? Do an activity or a hobby, like making bead jewelry, practicing needlepoint, or working with wood. ? Mix up your normal routine. ? Take a short exercise break. Go for a quick walk or run up and down stairs. ? Spend time in public places where smoking is not allowed.  Focus on doing something kind or helpful for someone else.  Call a friend or family member to talk during a craving.  Join a support group.  Call a quit line, such as 1-800-QUIT-NOW.  Talk with your health care provider about medicines that might help you cope with cravings and make quitting easier for you.  How can I deal with withdrawal symptoms? Your body may experience negative effects as it tries to get used to not having nicotine in the system. These effects are called withdrawal symptoms. They may include:  Feeling hungrier than normal.  Trouble concentrating.  Irritability.  Trouble sleeping.  Feeling depressed.  Restlessness and agitation.  Craving a cigarette.  To manage withdrawal symptoms:  Avoid places, people, and activities that trigger your cravings.  Remember why you want to quit.  Get plenty of sleep.  Avoid coffee and other caffeinated drinks. These may worsen some of your  symptoms.  How can I handle social situations? Social situations can be difficult when you are quitting smoking, especially in the first few weeks. To manage this, you can:  Avoid parties, bars, and other social situations where people might be smoking.  Avoid alcohol.  Leave right away if you have the urge to smoke.  Explain to your family and friends that you are quitting smoking. Ask for understanding and support.  Plan activities with friends or family where smoking is not an option.  What are some ways I can cope with stress? Wanting to smoke may cause stress, and stress can make you want to smoke. Find ways to manage your stress. Relaxation techniques can help. For example:  Breathe slowly and deeply, in through your nose and out through your mouth.  Listen to soothing, relaxing music.  Talk with a family member or friend about your stress.  Light a candle.  Soak in a bath or take a shower.  Think about a peaceful place.  What are some ways I can prevent weight gain? Be aware that many people gain weight after they quit smoking. However, not everyone does. To keep from gaining weight, have a plan in place before you quit and stick to the plan after you quit. Your plan should include:  Having healthy snacks. When you have a craving, it may help to: ? Eat plain popcorn, crunchy carrots, celery, or other cut vegetables. ? Chew sugar-free gum.  Changing how you eat: ? Eat small portion sizes at meals. ?   Eat 4-6 small meals throughout the day instead of 1-2 large meals a day. ? Be mindful when you eat. Do not watch television or do other things that might distract you as you eat.  Exercising regularly: ? Make time to exercise each day. If you do not have time for a long workout, do short bouts of exercise for 5-10 minutes several times a day. ? Do some form of strengthening exercise, like weight lifting, and some form of aerobic exercise, like running or  swimming.  Drinking plenty of water or other low-calorie or no-calorie drinks. Drink 6-8 glasses of water daily, or as much as instructed by your health care provider.  Summary  Quitting smoking is a physical and mental challenge. You will face cravings, withdrawal symptoms, and temptation to smoke again. Preparation can help you as you go through these challenges.  You can cope with cravings by keeping your mouth busy (such as by chewing gum), keeping your body and hands busy, and making calls to family, friends, or a helpline for people who want to quit smoking.  You can cope with withdrawal symptoms by avoiding places where people smoke, avoiding drinks with caffeine, and getting plenty of rest.  Ask your health care provider about the different ways to prevent weight gain, avoid stress, and handle social situations. This information is not intended to replace advice given to you by your health care provider. Make sure you discuss any questions you have with your health care provider. Document Released: 06/27/2016 Document Revised: 06/27/2016 Document Reviewed: 06/27/2016 Elsevier Interactive Patient Education  2018 Elsevier Inc.  

## 2017-11-26 ENCOUNTER — Encounter: Payer: Self-pay | Admitting: Family Medicine

## 2017-11-27 LAB — CYCLIC CITRUL PEPTIDE ANTIBODY, IGG: Cyclic Citrullin Peptide Ab: <16 UNITS

## 2017-11-27 LAB — ANA: ANA: NEGATIVE

## 2017-11-27 LAB — RHEUMATOID FACTOR: Rhuematoid fact SerPl-aCnc: <14 IU/mL (ref <14)

## 2017-11-30 ENCOUNTER — Other Ambulatory Visit: Payer: Self-pay | Admitting: Family Medicine

## 2017-12-03 ENCOUNTER — Encounter: Payer: Self-pay | Admitting: Family Medicine

## 2017-12-18 ENCOUNTER — Other Ambulatory Visit: Payer: Self-pay | Admitting: Family Medicine

## 2017-12-23 ENCOUNTER — Other Ambulatory Visit: Payer: Self-pay | Admitting: Adult Health

## 2017-12-23 NOTE — Telephone Encounter (Signed)
[facility]. Pt last seen 09/6/20217. Rx has been denied, as pt needs apt for further refills.

## 2017-12-28 ENCOUNTER — Other Ambulatory Visit: Payer: Self-pay | Admitting: Family Medicine

## 2018-01-08 DIAGNOSIS — M533 Sacrococcygeal disorders, not elsewhere classified: Secondary | ICD-10-CM | POA: Diagnosis not present

## 2018-01-08 DIAGNOSIS — M545 Low back pain: Secondary | ICD-10-CM | POA: Diagnosis not present

## 2018-01-08 DIAGNOSIS — M5442 Lumbago with sciatica, left side: Secondary | ICD-10-CM | POA: Diagnosis not present

## 2018-01-08 DIAGNOSIS — M542 Cervicalgia: Secondary | ICD-10-CM | POA: Diagnosis not present

## 2018-01-11 DIAGNOSIS — M5441 Lumbago with sciatica, right side: Secondary | ICD-10-CM | POA: Diagnosis not present

## 2018-01-11 DIAGNOSIS — M545 Low back pain: Secondary | ICD-10-CM | POA: Diagnosis not present

## 2018-01-11 DIAGNOSIS — M542 Cervicalgia: Secondary | ICD-10-CM | POA: Diagnosis not present

## 2018-01-11 DIAGNOSIS — M5442 Lumbago with sciatica, left side: Secondary | ICD-10-CM | POA: Diagnosis not present

## 2018-01-11 DIAGNOSIS — M533 Sacrococcygeal disorders, not elsewhere classified: Secondary | ICD-10-CM | POA: Diagnosis not present

## 2018-02-01 ENCOUNTER — Other Ambulatory Visit: Payer: Self-pay | Admitting: Family Medicine

## 2018-02-01 DIAGNOSIS — G894 Chronic pain syndrome: Secondary | ICD-10-CM | POA: Diagnosis not present

## 2018-02-01 DIAGNOSIS — M545 Low back pain: Secondary | ICD-10-CM | POA: Diagnosis not present

## 2018-02-08 DIAGNOSIS — F332 Major depressive disorder, recurrent severe without psychotic features: Secondary | ICD-10-CM | POA: Diagnosis not present

## 2018-02-15 ENCOUNTER — Other Ambulatory Visit: Payer: Self-pay | Admitting: Family Medicine

## 2018-02-22 ENCOUNTER — Encounter: Payer: Self-pay | Admitting: Family Medicine

## 2018-04-04 ENCOUNTER — Encounter: Payer: Self-pay | Admitting: Family Medicine

## 2018-04-07 ENCOUNTER — Ambulatory Visit (INDEPENDENT_AMBULATORY_CARE_PROVIDER_SITE_OTHER): Payer: Medicare Other | Admitting: Family Medicine

## 2018-04-07 ENCOUNTER — Encounter: Payer: Self-pay | Admitting: Family Medicine

## 2018-04-07 ENCOUNTER — Other Ambulatory Visit: Payer: Self-pay

## 2018-04-07 VITALS — BP 110/64 | HR 96 | Temp 98.4°F | Ht 67.0 in | Wt 200.8 lb

## 2018-04-07 DIAGNOSIS — R600 Localized edema: Secondary | ICD-10-CM

## 2018-04-07 DIAGNOSIS — R062 Wheezing: Secondary | ICD-10-CM | POA: Diagnosis not present

## 2018-04-07 LAB — BASIC METABOLIC PANEL
BUN: 8 mg/dL (ref 6–23)
CHLORIDE: 103 meq/L (ref 96–112)
CO2: 26 meq/L (ref 19–32)
CREATININE: 0.76 mg/dL (ref 0.40–1.20)
Calcium: 9 mg/dL (ref 8.4–10.5)
GFR: 81.4 mL/min (ref >60.00)
Glucose, Bld: 109 mg/dL — ABNORMAL HIGH (ref 70–99)
Potassium: 4 mEq/L (ref 3.5–5.1)
SODIUM: 138 meq/L (ref 135–145)

## 2018-04-07 LAB — HEPATIC FUNCTION PANEL
ALK PHOS: 106 U/L (ref 39–117)
ALT: 14 U/L (ref 0–35)
AST: 13 U/L (ref 0–37)
Albumin: 3.7 g/dL (ref 3.5–5.2)
BILIRUBIN DIRECT: 0.1 mg/dL (ref 0.0–0.3)
TOTAL PROTEIN: 6.6 g/dL (ref 6.0–8.3)
Total Bilirubin: 0.3 mg/dL (ref 0.2–1.2)

## 2018-04-07 LAB — BRAIN NATRIURETIC PEPTIDE: Pro B Natriuretic peptide (BNP): 18 pg/mL (ref 0.0–100.0)

## 2018-04-07 MED ORDER — FUROSEMIDE 20 MG PO TABS: 20.0000 mg | ORAL_TABLET | Freq: Every day | ORAL | 3 refills | Status: DC

## 2018-04-07 MED ORDER — METHYLPREDNISOLONE ACETATE 80 MG/ML IJ SUSP
80.0000 mg | Freq: Once | INTRAMUSCULAR | Status: AC
  Administered 2018-04-07: 80 mg via INTRAMUSCULAR

## 2018-04-07 NOTE — Patient Instructions (Addendum)
  Cynthia [name], [facility] ([facility])(  nurse/ Social worker will outreach you. Also, try to call PACE (medicare managed program)  to discuss your situation to see if they can assist  You can call Darl Pikes [phone] and this is my private line here at [facility] Monday, Tuesday and Wednesday   Women's Resource Center: In [place] offers general support to women in transition. www.womenscentergso.org or call         904-280-7672.   American Red Cross:  Of [place] provides emergency help to individuals and families, and also trains community volunteers in emergency and disaster relief.  http://gso.redcross.org or call     (978)004-1751.  [employer]: In [place].org or call 540 586 3674.  Family Service of the [place]: Provides comprehensive counseling services to individuals, children and families, operating on a sliding fee based on income.  They provide support, treatment, and crisis services including bankruptcy, child abuse, sexual assault, and domestic violence.  In addition FSP works to eliminate family and domestic violence.  www.familyservices-[place].org or call 873-201-8820 or 8282530553, crisis line 3251204790.  Family Services Crises Line: In [place] 756-4332  Family and Friends Group: For those who have a loved one with a mental illness, meets each Tuesday, 7:00 p.m., [place], [address] [place], [place], Room 4. PhotoSolver.pl or call 901-001-0458.   [employer] in [place]: Provides adoption, foster care, counseling, immigration and refugee, and inmate family support services- www.lfscarolinas.org or call (475)693-3693.  [facility]: (816) 548-0313.  Homeless Resources:  [employer]: 818-494-8444.  [employer]: Public Housing: (920) 133-2758.  Homeless Prevention of [place]: Provides community planning around issues of homelessness and transition into permanent housing.   It's over 40-member programs span the range of services and options.  In addition, this website maintains up to date statistics on homelessness in our community.  www.hpcgc,org  [employer]: A non-profit organization based in [place] for those in need through their food kitchen or emergency shelter for men.  www.odm-hp.org or call (607) 664-5774.  Thank you for taking time to come for your Medicare Wellness Visit. I appreciate your ongoing commitment to your health goals. Please review the following plan we discussed and let me know if I can assist you in the future.   [place].tn.[employer]; (531) 527-7540 Sr. Glennon [place]; (267)011-1738 Get resource to get information on any and all community programs for Seniors  Community solutions; "Aging Gracefully In Place" program; can request or apply  [place]: [phone] [facility] Response Program -[phone] Public Health Dept; Need to be a skilled visit but can assist with bathing as well; 505-411-0103   Help with Rx at [facility]  Monday - Friday 8am to 10pm EST Sat- Sunday 9am to 7pm Patient help line 551 567 9882 Email support online at http://www.thompson.com/  Dept of Social Services; Call (431)670-0790 and ask for SW on call  Options for Medicaid include the Community Alternatives program; [place]-PCS.org (personal care services) or PACE program, which is a medical and social program combined    [employer]ant.at general resources for food etc   Http://nihseniorhealth.giv  Deaf & Hard of Hearing Division Services - can assist with hearing aid x 1  No reviews  [facility]  [address] [place] [address]  [phone]  [employer]: [phone]   Darl Pikes will have you back in for an apt to do your wellness and review your preventive health .

## 2018-04-07 NOTE — Progress Notes (Signed)
Subjective:   Cynthia [name] is a 62 y.o. 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  On disability check 725.00 and this is helping with gas Car payment is 400.00  Has a cell phone.[phone]   Been in touch with HUD in [facility]. She is on a waiting list for housing but states there is a 2 year wait.  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 She has not rec'd her first pay check as yet.  Tried to access homeless assistance but has a dog and want leave her dog.   Dr. Ernestine [name] following psych   Has her meds per her report   Plan; Given resources for food and assistance in the community  Given list of resources   Referral to [facility] for RN (hx copd) and SW follow up. Their number is [phone] -  Given the number for the PACE Program   Given number Mon-Wed to call for any further assistance  [phone] )  Will do wellness exam when her life stabilizes       Health Maintenance Due  Topic Date Due  . Hepatitis C Screening  Oct 15, 1955  . HIV Screening  02/23/1969  . PAP SMEAR  07/27/2014  . TETANUS/TDAP  12/27/2017  . INFLUENZA VACCINE  02/11/2018         Objective:     Vitals: BP 110/64   Pulse 96   Temp 98.4 F (36.9 C) (Oral)   Ht 5\' 7"  (1.702 m)   Wt 200 lb 12.8 oz (91.1 kg)   SpO2 93%   BMI 31.45 kg/m   Body mass index is 31.45 kg/m.    Past Medical History:  Diagnosis Date  . Bipolar disorder (HCC)   . COPD (chronic obstructive pulmonary disease) (HCC)   . Depression    . GERD (gastroesophageal reflux disease)    Past Surgical History:  Procedure Laterality Date  . APPENDECTOMY    . CHOLECYSTECTOMY    . LUMBAR LAMINECTOMY     6 surgeries   Family History  Problem Relation Age of Onset  . Alzheimer's disease Mother   . Colon cancer Father    Social History   Socioeconomic History  . Marital status: Divorced    Spouse name: Not on file  . Number of children: Not on file  . Years of education: Not on file  . Highest education level: Not on file  Occupational History  . Not on file  Social Needs  . Financial resource strain: Not on file  . Food insecurity:    Worry: Not on file    Inability: Not on file  . Transportation needs:    Medical: Not on file    Non-medical: Not on file  Tobacco Use  . Smoking status: Current Every Day Smoker    Types: Cigarettes  . Smokeless tobacco: Never Used  Substance and Sexual Activity  . Alcohol use: No    Frequency: Never  . Drug use: No  .  Sexual activity: Never  Lifestyle  . Physical activity:    Days per week: Not on file    Minutes per session: Not on file  . Stress: Not on file  Relationships  . Social connections:    Talks on phone: Not on file    Gets together: Not on file    Attends religious service: Not on file    Active member of club or organization: Not on file    Attends meetings of clubs or organizations: Not on file    Relationship status: Not on file  Other Topics Concern  . Not on file  Social History Narrative  . Not on file    Outpatient Encounter Medications as of 04/07/2018  Medication Sig  . ADVAIR DISKUS 100-50 MCG/DOSE AEPB INHALE 1 PUFF INTO THE LUNGS 2 (TWO) TIMES DAILY AT 10 AM AND 5 PM.  . albuterol (VENTOLIN HFA) 108 (90 Base) MCG/ACT inhaler INHALE 2 PUFFS INTO THE LUNGS EVERY 4 HOURS AS NEEDED FOR WHEEZING OR SHORTNESS OF BREATH  . amphetamine-dextroamphetamine (ADDERALL) 20 MG tablet Take 20 mg by mouth 3 (three) times daily.   . ARIPiprazole (ABILIFY) 5  MG tablet Take 5 mg by mouth every morning.  . fesoterodine (TOVIAZ) 4 MG TB24 tablet Take 1 tablet (4 mg total) by mouth daily.  Marland Kitchen. HYDROmorphone, PF, (DILAUDID HP) 250 MG injection Inject as directed. PT CURRENTLY WEARS A PAIN PUMP.  . hydrOXYzine (VISTARIL) 50 MG capsule Take 50 mg by mouth daily.   Marland Kitchen. levothyroxine (SYNTHROID, LEVOTHROID) 50 MCG tablet TAKE 1 TABLET (50 MCG TOTAL) BY MOUTH DAILY. Office visit for more refills  . ranitidine (ZANTAC) 150 MG tablet TAKE 1 TABLET BY MOUTH TWICE A DAY  . venlafaxine XR (EFFEXOR-XR) 150 MG 24 hr capsule 150 mg. Two tablets in the morning  . furosemide (LASIX) 20 MG tablet Take 1 tablet (20 mg total) by mouth daily.  Marland Kitchen. umeclidinium bromide (INCRUSE ELLIPTA) 62.5 MCG/INH AEPB Inhale 1 puff into the lungs daily. (Patient not taking: Reported on 04/07/2018)  . [EXPIRED] methylPREDNISolone acetate (DEPO-MEDROL) injection 80 mg    No facility-administered encounter medications on file as of 04/07/2018.    Screening Tests Health Maintenance  Topic Date Due  . Hepatitis C Screening  1956-02-14  . HIV Screening  02/23/1969  . PAP SMEAR  07/27/2014  . TETANUS/TDAP  12/27/2017  . INFLUENZA VACCINE  02/11/2018       Plan:   Seen today to evaluate for assistance. Presented homeless x 2 months and sleeping in the car with her dog at service station in [place] with the owner's approval. No acute distress but was seen for COPD today.  On disability check 725.00 and this is helping with gas Car payment is 400.00  Has a cell [phone]   Been in touch with [place]UD [place] [facility] [place]. She is on a waiting list for housing but states there is a 2 year wait.   Relying on friends Part time job doing working at a pharmacy in [place]. She has not rec'd her first pay check as yet.  Tried to access homeless assistance but has a dog and want leave her dog.    [name] following psych   Has her meds Given resources for food and other Call  to [facility] SW-  Recommended she call PACE, since she is on medicare disability  Their number is [phone]  [facility] ([facility] and SW follow up.  Given number Mon-Wed to  call for any further assistance  Will do wellness exam when her life stabilizes   Plan: Referral made to [facility]  Given the number for [facility] resources given for other assistance    I have personally reviewed and noted the following in the patient's chart:   . Medical and social history . Use of alcohol, tobacco or illicit drugs  . Current medications and supplements . Functional ability and status . Nutritional status . Physical activity . Advanced directives . List of other physicians . Hospitalizations, surgeries, and ER visits in previous 12 months . Vitals . Screenings to include cognitive, depression, and falls . Referrals and appointments  In addition, I have reviewed and discussed with patient certain preventive protocols, quality metrics, and best practice recommendations. A written personalized care plan for preventive services as well as general preventive health recommendations were provided to patient.     Montine Circle, RN  04/07/2018

## 2018-04-07 NOTE — Progress Notes (Signed)
  Subjective:     Patient ID: Cynthia [name], female   DOB: 04-23-1956, 62 y.o.   MRN: [id number]  HPI Patient seen without 72-month history of some bilateral leg edema.  She has very difficult situation in that she has been homeless for about 2 months and living out of her car and thus unable to elevate her legs.  Her diet has been very erratic.  No history of heart failure.  She has multiple chronic problems including COPD, obstructive sleep apnea, GERD, hypothyroidism, bipolar disorder, history of nicotine use.  Obviously, not using CPAP at this time.  She recently ran out of her  Eliptra inhaler.  She is using Advair and Ventolin as needed.  Denies chest pain.  No orthopnea.  She relates at least 1 week history of increased cough and wheezing.  Known history of COPD as above.  Cough mostly productive of clear sputum.  No hemoptysis.  No fevers or chills.  She has chronic back pain and is followed by pain management.  Past Medical History:  Diagnosis Date  . Bipolar disorder (HCC)   . COPD (chronic obstructive pulmonary disease) (HCC)   . Depression   . GERD (gastroesophageal reflux disease)    Past Surgical History:  Procedure Laterality Date  . APPENDECTOMY    . CHOLECYSTECTOMY    . LUMBAR LAMINECTOMY     6 surgeries    reports that she has been smoking cigarettes. She has never used smokeless tobacco. She reports that she does not drink alcohol or use drugs. family history includes Alzheimer's disease in her mother; Colon cancer in her father. Allergies  Allergen Reactions  . Codeine Phosphate Nausea And Vomiting     Review of Systems  Constitutional: Negative for chills and fever.  Respiratory: Positive for cough, shortness of breath and wheezing.   Cardiovascular: Positive for leg swelling. Negative for chest pain and palpitations.  Gastrointestinal: Negative for abdominal pain.  Neurological: Negative for dizziness.       Objective:   Physical Exam   Constitutional: She appears well-developed.  Cardiovascular: Normal rate.  Pulmonary/Chest: Effort normal.  There is some diffuse expiratory wheezes.  No rales.  Pulse oximetry 93%.  No respiratory distress.  No retractions.  Normal respiratory rate  Musculoskeletal: She exhibits edema.  1+ pitting edema lower legs ankles feet bilaterally       Assessment:     #1 bilateral leg edema.  Probably combination of factors including venous stasis, inability to elevate legs, question diastolic dysfunction, dietary-as patient is currently homeless and possibly eating more sodium than usual and lower protein  #2 COPD with acute exacerbation.  No respiratory distress.    Plan:     -Check labs with BMP, BNP level, hepatic panel -Furosemide 20 mg once daily -Depo-Medrol 80 mg IM given because of her wheezing. -Low threshold for antibiotic for any fever or productive cough -will see if we can find any community resources/ ?[facility] social work referral.  Kristian Covey MD [facility]

## 2018-04-08 ENCOUNTER — Other Ambulatory Visit: Payer: Self-pay

## 2018-04-08 ENCOUNTER — Telehealth: Payer: Self-pay | Admitting: Pharmacist

## 2018-04-08 LAB — HEPATITIS C ANTIBODY
HEP C AB: NONREACTIVE
SIGNAL TO CUT-OFF: 0.05 (ref <1.00)

## 2018-04-08 NOTE — Patient Outreach (Signed)
[facility]) Care Management  04/08/2018  Cynthia [name] 1956/04/14 [id number]   Telephone Screen  Referral Date: 04/08/18 Referral Source: MD office Referral Reason: "COPD, homeless for two months due to loss of job, has lived in her car with dog" Insurance: Medicare   Outreach attempt # 1  to patient. Spoke with patient and screening completed.   Social: patient stets that she lost her condo about two months ago due to inability to pay payments after losing her job. She has been sleeping in her car along with her dog at a local gas station for the past two months. Per MD notes patient gets disability check of $725/month and has care payment of $400/month. She has contatced [facility] and waiting list is 74yr. Patient voices that she has no family and friend support or anywhere lese to stay. She has been given homeless shelter resources but patient is adamant that she will only go somewhere that accepts pets ans she will not be separated from her dog. She states that her "dog is all" she has and will not give him up. She would rather sleep in her and be with her dog versus in a shelter without them. RN CM had long discussion with patient regarding how difficult it would be to place her and her dog in a shelter and strongly encouraged her to find temporary alternative option for her dog so that her basic needs could be met. She states that she just recently started working at [employer] time but has not gotten her first paycheck yet. She voices that she is independent with ADLs/IADLs. She drives herself to appts.    Conditions:Per chart review, patient has PMH of COPD, bipolar, depression and GERD. She states that she has chronic back pain due to six back surgeries in the past. She voices that she has a pain pump and is followed by pain mgmt specialist. She voices that patient is controlled at present. She saw PCP on yesterday for some bilateral swelling.  Patient voices that she knows it is caused by her sleeping in the driver side of her car and not being able to elevate her legs. RN CM suggested to patient that she trying sleeping in the back seat to allow elevation of affected extremities. She declined suggestion and stated that her dog sleeps back there as it is more comfortable for the dog and she wants the dog to be comfortable. Patient seems more focused and concerned regarding the dog versus her own medical health needs. She declined needing further education and/or support in managing medical conditions and voices she does not need RN services at this time as she knows how to manage COPD on her own.   Medications: Patient reports that she is taking about 10 meds. She reports that she is out of some of her meds and will not be able to pick them until a week or so when she gets her check. She reports that PCP called in two new prescriptions for her yesterday to help with swelling and congestion.However, she is not able to afford to pick them up for another week per patient report.    Appointments: She is followed by PCP-saw 04/07/18 as well as Dr. PElisabeth Cara(psych) and Dr. NErnest Pinemgmt).   Advance Directives: None. Patient interested in further info.   Cons[facility] and [facility]. patient only [facility] and [facility]able [facility] and [facility] assistance. Verbal consent for services given.   Plan:  RN CM will send [facility] referral for possible housing assistance and community resources. RN CM will send [facility] pharmacy referral for possible medication assistance. RN CM will mail patient advance directive packet.    Enzo Montgomery, RN,BSN,CCM [facility] Telephonic Care Management Coordinator Direct Phone: [phone] Toll Free: [phone] Fax: [phone]

## 2018-04-08 NOTE — Patient Outreach (Signed)
[facility]) Care Management  [facility]   04/08/2018  Cynthia [name] Jan 10, 1956 [id number]  Subjective: Patient was called regarding medication assistance per referral.  HIPAA identifiers were obtained. Patient is a 62 year old female with multiple medical conditions including but not limited to: bipolar disorder, COPD, GERD, obesity, tobacco use, OSA, and hypothyroidism.  Patient reported that she was called in a medication yesterday but she is out of money until next week and wanted assistance.   Objective:  Patient was called regarding medication assistance per referral.    Encounter Medications: Outpatient Encounter Medications as of 04/08/2018  Medication Sig  . ADVAIR DISKUS 100-50 MCG/DOSE AEPB INHALE 1 PUFF INTO THE LUNGS 2 (TWO) TIMES DAILY AT 10 AM AND 5 PM.  . albuterol (VENTOLIN HFA) 108 (90 Base) MCG/ACT inhaler INHALE 2 PUFFS INTO THE LUNGS EVERY 4 HOURS AS NEEDED FOR WHEEZING OR SHORTNESS OF BREATH  . amphetamine-dextroamphetamine (ADDERALL) 20 MG tablet Take 20 mg by mouth 3 (three) times daily.   . ARIPiprazole (ABILIFY) 5 MG tablet Take 5 mg by mouth every morning.  . fesoterodine (TOVIAZ) 4 MG TB24 tablet Take 1 tablet (4 mg total) by mouth daily.  . furosemide (LASIX) 20 MG tablet Take 1 tablet (20 mg total) by mouth daily.  Marland Kitchen HYDROmorphone, PF, (DILAUDID HP) 250 MG injection Inject as directed. PT CURRENTLY WEARS A PAIN PUMP.  . hydrOXYzine (VISTARIL) 50 MG capsule Take 50 mg by mouth daily.   Marland Kitchen levothyroxine (SYNTHROID, LEVOTHROID) 50 MCG tablet TAKE 1 TABLET (50 MCG TOTAL) BY MOUTH DAILY. Office visit for more refills  . ranitidine (ZANTAC) 150 MG tablet TAKE 1 TABLET BY MOUTH TWICE A DAY  . venlafaxine XR (EFFEXOR-XR) 150 MG 24 hr capsule 150 mg. Two tablets in the morning  . NUCYNTA 75 MG tablet Take 1 tablet by mouth 2 (two) times daily.  Marland Kitchen umeclidinium bromide (INCRUSE ELLIPTA) 62.5 MCG/INH AEPB Inhale 1 puff into the lungs daily.  (Patient not taking: Reported on 04/07/2018)   No facility-administered encounter medications on file as of 04/08/2018.     Functional Status: In your present state of health, do you have any difficulty performing the following activities: 04/07/2018  Hearing? N  Vision? N  Difficulty concentrating or making decisions? N  Walking or climbing stairs? N  Dressing or bathing? N  Doing errands, shopping? N  Some recent data might be hidden    Fall/Depression Screening: Fall Risk  04/08/2018  Falls in the past year? No   PHQ 2/9 Scores 04/08/2018  PHQ - 2 Score 0      Assessment: Patient's medications were reviewed via telephone.   Drugs sorted by system:  Neurologic/Psychologic: Aripiprazole,Amphetamine-dextroamphetamine, Venlafaxine  Cardiovascular: Furosemide (had not picked up yet)  Pulmonary/Allergy: Advair, Incruise, Albuterol HFA, Hydroxyzine,   Gastrointestinal: Ranitidine,  Endocrine: Levothyroxine,   Pain: Nucynta Hydromorphone (via pain pump)  Genitourinary- Toviaz  Medication Assistance Findings: Patient has full Extra Help and has [place] Medicare Part D Plan  Patient said she would be able to afford her medications next week but needed help with the copay for what Dr. Caryl Never called in yesterday.  [facility]aryl Never sent in a prescription for Furosemide and [facility] and [facility]0.00.  Patient was informed and said she would go and pick up the prescription ASAP.  Patient said she needed a refill on Incruse that she can pick up next week.  Dr. Garwin Brothers office was called and the refill  for Incruse was requested.  Plan: Route note to patient's PCP Close patient's case as she was referred for medication assistance but she has full extra help and as such is not eligible for patient assistance programs.    Send notice to [facility] Social Worker, [employer] as she is still active with the patient.     Beecher Mcardle,  PharmD, BCACP [facility] Clinical Pharmacist [phone]

## 2018-04-09 ENCOUNTER — Telehealth: Payer: Self-pay | Admitting: Family Medicine

## 2018-04-09 ENCOUNTER — Other Ambulatory Visit: Payer: Self-pay

## 2018-04-09 MED ORDER — UMECLIDINIUM BROMIDE 62.5 MCG/INH IN AEPB: 1.0000 | INHALATION_SPRAY | Freq: Every day | RESPIRATORY_TRACT | 6 refills | Status: DC

## 2018-04-09 NOTE — Patient Outreach (Signed)
[facility]) Care Management  04/09/2018  Cynthia [name] [birth year] [id number]  BSW attempted to contact the patient on today's date to conduct a community resource consult. Unfortunately, today's call was unsuccessful. BSW left the patient a HIPAA compliant voice message requesting a return call.   Plan: BSW will mail the patient an unsuccessful outreach letter. BSW will attempt the patient again within the next four business days.  Bevelyn Ngo, BSW, CDP [facility] [phone]

## 2018-04-09 NOTE — Telephone Encounter (Signed)
Rx sent 

## 2018-04-09 NOTE — Telephone Encounter (Signed)
Previously sent to office for provider review; see telephone encounter dated 04/08/18 by Nunzio Cobbs; pt last seen in office 04/07/18' original prescription written per Dr Christene Slates, Pulmonology, in 2017 (see note from Kandice Robinsons dated 07/29/2017.  incruse ellipta refill Last Refill:? # ? Last OV: 04/07/18 PCP: Dr Evelena Peat [facility] [place] [place]

## 2018-04-09 NOTE — Telephone Encounter (Signed)
Please advise if okay to refill. 

## 2018-04-09 NOTE — Telephone Encounter (Signed)
Refills OK. 

## 2018-04-09 NOTE — Addendum Note (Signed)
Addended by: Kern Reap B on: 04/09/2018 04:32 PM   Modules accepted: Orders

## 2018-04-09 NOTE — Telephone Encounter (Signed)
Copied from CRM [id number]. Topic: Quick Communication - Rx Refill/Question >> Apr 09, 2018 10:47 AM Mcneil, Ja-Kwan wrote: Medication: umeclidinium bromide (INCRUSE ELLIPTA) 62.5 MCG/INH AEPB  Has the patient contacted their pharmacy? yes   Preferred Pharmacy (with phone number or street name): [id number] [place], [place] - [address] AT [facility] [phone] (Phone) [phone] (Fax)  Agent: Please be advised that RX refills may take up to 3 business days. We ask that you follow-up with your pharmacy.

## 2018-04-12 ENCOUNTER — Encounter: Payer: Self-pay | Admitting: Family Medicine

## 2018-04-13 ENCOUNTER — Other Ambulatory Visit: Payer: Self-pay | Admitting: Family Medicine

## 2018-04-14 ENCOUNTER — Other Ambulatory Visit: Payer: Self-pay | Admitting: Family Medicine

## 2018-04-14 MED ORDER — FESOTERODINE FUMARATE ER 4 MG PO TB24: 4.0000 mg | ORAL_TABLET | Freq: Every day | ORAL | 0 refills | Status: DC

## 2018-04-15 ENCOUNTER — Other Ambulatory Visit: Payer: Self-pay

## 2018-04-15 NOTE — Patient Outreach (Signed)
[facility]) Care Management  04/15/2018  [name] 11/29/1955 [id number]  Successful outreach to the patient on today's date, HIPAA identifiers confirmed. BSW introduced self to the patient and the reason for today's call, indicating this BSW received a referral to assist the patient with housing resources. The patient is currently homeless and living out of her car. The patient reports she has recently reapplied for Medicaid within the last two weeks after loosing it due to a discrepancy in the renewal paperwork. The patient reports a monthly SSI income of $60. The patient reports she has recently began a small part time job. The patient is currently living in her car with her dog.  The patient is not willing to give her dog to someone else in order to stay in a shelter. BSW discussed barriers surrounding this decision considering all shelters are pet free. The patient acknowledged this stating "yeah I know". BSW spoke with the patient about pet restrictions for public housing as well. The patient reports prior to becoming homeless she lived in public housing with her dog. The patient stated she has a letter stating the dog is a service animal.   The patient reports she previously lived in public housing until loosing her job and being unable to pay the rent. BSW asked the patient if she has contacted the housing authority to inquire about moving back into public housing. The patient reports she contacted the apartment manager who informed the patient she was unable to come back. BSW encouraged the patient to contact the Housing Authority to discuss further. The patient stated understanding and confirmed she would call at the end of this call.  BSW spoke with the patient about limited options due to her decision to keep her pet. The patient has not been able to save any income. The patient reports a monthly car payment of $350. The patient declines the option to sell her car  in order to obtain money for housing stating "then I would have to buy another one and I don't have good credit". BSW acknowledged the patients right to make her own decisions and reiterated the barriers to resources given the patients decision to stay in the care of her pet. The patient stated understanding.  Plan: BSW to contact the patient next week to confirm linking to [employer]. BSW will perform a case closure during the next call considering the patient is aware there are no emergency housing options that allow pets and she is not willing to give up her pet.  Bevelyn Ngo, BSW, CDP [facility] [phone]

## 2018-04-19 DIAGNOSIS — M5417 Radiculopathy, lumbosacral region: Secondary | ICD-10-CM | POA: Diagnosis not present

## 2018-04-19 DIAGNOSIS — G894 Chronic pain syndrome: Secondary | ICD-10-CM | POA: Diagnosis not present

## 2018-04-19 DIAGNOSIS — M545 Low back pain: Secondary | ICD-10-CM | POA: Diagnosis not present

## 2018-04-21 ENCOUNTER — Other Ambulatory Visit: Payer: Self-pay

## 2018-04-21 NOTE — Patient Outreach (Signed)
[facility]) Care Management  04/21/2018  Cynthia [name] 03/28/1956 [id number]  BSW contacted the patient on today's date, HIPAA identifiers confirmed. The patient indicated she has contacted [employer]. Unfortunately, the patient was informed she would be ineligible for services for the next five years due to recent eviction. The patient reports having a tough day stating "It's looking bleak today". The patient is noted to be tearful on today's call. The patient confirms she has all her prescribed medications and is taking appropriately. The patient reports having a supportive network. The patient reports she has only [employer] for housing as she "did not know any other shelters". The patient has given this BSW permission to outreach other resources.  BSW outreached the following:  [place] [phone]: No available space. Does not allow pets even if they are service animals.  [employer] [place]: BSW left a voice message on the shelter intake mailbox. Awaiting a return call.  [facility] ([facility]) [phone]: Located in [place] and assists low income/homeless population with a housing program. Orientation is held every Tuesday (10 - 11am) and Thursday (3-4pm). No appointment is necessary to attend orientation. During orientation, participants learn about the program and undergo an eligibility screening. If the participant meets criteria they will be assisted with a housing program. BSW discussed the patients service animal. The patient would not be permitted to access the emergency shelter with the service animal but may qualify for housing as the pet policy is at the discretion of landlords over each housing opportunity. Orientation is held at [place]  [place]: BSW spoke with Earnest Bailey to discuss patients current  disposition and need for housing which allows service dogs. Holly informed BSW she does not know of any shelters or emergency housing that can accommodate pets due to other residents. Earnest Bailey suggested the following resources for the patient: The [facility] [place], [place] [facility]).  BSW contacted the patient to discuss findings. BSW provided the patient with the contact information and orientation hours for [facility] in [place]. The patient stated she was available tomorrow afternoon and plans to attend. The patient reports previous knowledge if [facility] in [place] and states she has been there before. BSW provided the patient with contact information for [employer]. BSW encouraged the patient to contact them to arrange an appointment with a housing  Counselor. BSW educated the patient that this agency assists community members in locating affordable housing to meet their needs. The patient took this number and stated she would call.  BSW also spoke with the patient about the [facility] which focuses on meeting the needs of disabled community members. The patient does receive disability due to depression. BSW informed patient that this resource focuses on several resources for disabled persons including housing. BSW provided the patient with the contact information and encouraged the patient to call as she could provide more of her specific needs during the call. The patient took the number and stated she would contact this resource as well.  Plan: BSW to outreach the patient in the next week to follow up on outcomes.  Daneen Schick, BSW, CDP [facility] [phone]

## 2018-04-23 ENCOUNTER — Other Ambulatory Visit: Payer: Self-pay

## 2018-04-23 NOTE — Patient Outreach (Signed)
[facility]) Care Management  04/23/2018  Cynthia [name] 09/01/1955 [id number]  Incoming call from [facility] with [facility] for [place]. They are currently accepting individuals for their wit list. Lawanna Kobus stated the wait list is somewhat long but they do plan to perform intakes on 10/17. BSW inquired if the patient could enter the program with her pet considering it is classified as a "service animal" per the patient. Lawanna Kobus stated she was not sure as this has never been asked but she would contact her supervisor, Ms. Donavan Foil to inquire about the pet policy. BSW provided [facility] with the patients referral information in order to get patient placed on the waiting list. Lawanna Kobus plans to determine if patient is eligible to participate with her pet and will contact BSW directly to inform of policy.   Bevelyn Ngo, BSW, CDP [facility] [phone]

## 2018-04-28 ENCOUNTER — Other Ambulatory Visit: Payer: Self-pay

## 2018-04-28 NOTE — Patient Outreach (Signed)
[name]  04/28/2018  Cynthia [name] February 20, 1956 [id number]  Successful outreach to the patient on today's date, HIPAA identifiers confirmed. The patient stated she was unable to attend orientation at [facility]) due to "being called into work". The patient states she plans to attend the orientation "tomorrow". The patient reports she attempted to contact the [place] [place] center but "it's been closed for a year". However, patient stated "someone" answered the phone and "plans to help some". The patient was unable to elaborate further.  BSW updated the patient that she has been placed on the [facility] waiting list. The patient is aware they may or may not allow service animals.   Plan: BSW to outreach patient in the next four weeks to determine outcome of orientation at [facility]. The patient is agreeable with this plan and has this BSW's contact information if assistance is needed prior to the next scheduled call.  Daneen Schick, BSW, CDP [facility] [phone]

## 2018-05-25 ENCOUNTER — Other Ambulatory Visit: Payer: Self-pay

## 2018-05-25 ENCOUNTER — Other Ambulatory Visit: Payer: Self-pay | Admitting: Family Medicine

## 2018-05-25 NOTE — Patient Outreach (Signed)
[facility]) Care Management  05/25/2018  Cynthia [name] [birth year] [id number]  Successful outreach to the patient, HIPAA identifiers confirmed. The patient states she continues to live in her car. The patient reports she did attend orientation at "[facility]" but states "it was just for families". The patient denies other options for housing such as a friend or family member. The patient states "do you have any other ideas? It's getting cold out". BSW revisited housing options such as emergency shelters. BSW further discussed all options do not allow pets. The patient continues to refuse to give up her pet in order to find housing. The patient again declines the option to sell her car which would save $350 per month which could be used toward an apartment.   BSW to perform a case closure as resources have been provided to the patient. The patient is aware she may contact [facility] Care Management in the future for a self-referral if desired.  Bevelyn Ngo, BSW, CDP [facility] [phone]

## 2018-06-14 DIAGNOSIS — F332 Major depressive disorder, recurrent severe without psychotic features: Secondary | ICD-10-CM | POA: Diagnosis not present

## 2018-06-21 ENCOUNTER — Encounter: Payer: Self-pay | Admitting: Family Medicine

## 2018-06-22 ENCOUNTER — Encounter: Payer: Self-pay | Admitting: Family Medicine

## 2018-06-22 ENCOUNTER — Other Ambulatory Visit: Payer: Self-pay

## 2018-06-22 ENCOUNTER — Ambulatory Visit (INDEPENDENT_AMBULATORY_CARE_PROVIDER_SITE_OTHER): Payer: Medicare Other | Admitting: Family Medicine

## 2018-06-22 VITALS — BP 118/72 | HR 102 | Temp 98.4°F | Ht 67.0 in | Wt 206.7 lb

## 2018-06-22 DIAGNOSIS — F17200 Nicotine dependence, unspecified, uncomplicated: Secondary | ICD-10-CM | POA: Diagnosis not present

## 2018-06-22 DIAGNOSIS — N3941 Urge incontinence: Secondary | ICD-10-CM

## 2018-06-22 DIAGNOSIS — Z23 Encounter for immunization: Secondary | ICD-10-CM

## 2018-06-22 DIAGNOSIS — J430 Unilateral pulmonary emphysema [MacLeod's syndrome]: Secondary | ICD-10-CM | POA: Diagnosis not present

## 2018-06-22 DIAGNOSIS — R109 Unspecified abdominal pain: Secondary | ICD-10-CM | POA: Diagnosis not present

## 2018-06-22 MED ORDER — UMECLIDINIUM BROMIDE 62.5 MCG/INH IN AEPB: 1.0000 | INHALATION_SPRAY | Freq: Every day | RESPIRATORY_TRACT | 11 refills | Status: DC

## 2018-06-22 NOTE — Progress Notes (Signed)
Subjective:     Patient ID: Cynthia [name], female   DOB: January 20, 1956, 62 y.o.   MRN: [id number]  HPI Patient seen today for medical follow-up.  She has chronic problems including COPD, obstructive sleep apnea, obesity, hypothyroidism, bipolar disorder, GERD, depression.  She has very difficult social situation.  She lost her job.  She is currently living out of her car with her dog.  She is on a waiting list for several apartments.  She is coping fairly well overall.  Needs flu vaccine.  We had made prior referral to social worker and she was given a list of several housing options but basically none of them would accept her dog but she has been unwilling to give up her dog  Issues with urine incontinence in the past.  She had 2 days last week particularly when she had severe urgency symptoms.  No burning.  No fever.  No chills.  Takes Toviaz 4 mg daily.  Minimal caffeine use.  Recent irritation right lower abdomen.  She has pain pump in this region and is followed by pain management specialist.  She has Dilaudid injection there regularly.  No stool changes.  Overdue for colonoscopy but declines at this time.  No appetite or weight changes  Past Medical History:  Diagnosis Date  . Bipolar disorder (HCC)   . COPD (chronic obstructive pulmonary disease) (HCC)   . Depression   . GERD (gastroesophageal reflux disease)    Past Surgical History:  Procedure Laterality Date  . APPENDECTOMY    . CHOLECYSTECTOMY    . LUMBAR LAMINECTOMY     6 surgeries    reports that she has been smoking cigarettes. She has never used smokeless tobacco. She reports that she does not drink alcohol or use drugs. family history includes Alzheimer's disease in her mother; Colon cancer in her father. Allergies  Allergen Reactions  . Codeine Phosphate Nausea And Vomiting     Review of Systems  Constitutional: Negative for chills, fatigue and fever.  Respiratory: Negative for cough and shortness of breath.    Cardiovascular: Negative for chest pain and leg swelling.  Gastrointestinal: Negative for abdominal distention, blood in stool, constipation, diarrhea, nausea and vomiting.  Genitourinary: Positive for urgency. Negative for difficulty urinating, flank pain and hematuria.       Objective:   Physical Exam  Constitutional: She appears well-developed and well-nourished.  Cardiovascular: Normal rate and regular rhythm.  Pulmonary/Chest: Effort normal.  She has a few faint scattered wheezes.  No rales.  No retractions.  Abdominal: Bowel sounds are normal. She exhibits no ascites.       Assessment:     #1 urinary urgency-still has some symptoms on Toviaz  #2 chronic pain followed by pain management  #3 poor social situation.  She is currently homeless living out of her vehicle  #4 COPD.  Ongoing nicotine use  #5 pain right abdomen around the site of her Dilaudid pump    Plan:     -Flu vaccine given.  She will need Prevnar 13 at 51 -We discussed other potential options for managing her urgency including Myrbetriq but at this point she wishes to wait -Refilled her Incruse Ellipta inhaler -We have encouraged her to follow-up with her pain management specialist who put in her pain pump in her right lower abdominal region.  She has some local irritation in that region and needs follow-up with them regarding that.  She does not have any fever, chills, warmth, redness to suggest likely  abscess  Kristian CoveyBruce W Marcelle Hepner MD [facility]

## 2018-06-24 ENCOUNTER — Telehealth: Payer: Self-pay | Admitting: Family Medicine

## 2018-06-24 NOTE — Telephone Encounter (Signed)
Copied from CRM [phone]. Topic: General - Other >> Jun 24, 2018  3:28 PM Tamela OddiHarris, Ryett Hamman J wrote: Reason for CRM: Caryn BeeKevin with [facility] called to verify if the patient's medication for Advair was sent in for the generic, Wixela.  Please call pharmacy to confirm that this is correct.  CB# [phone]

## 2018-06-25 NOTE — Telephone Encounter (Signed)
Please read message.   Is the generic OK for this patient?

## 2018-06-25 NOTE — Telephone Encounter (Signed)
ok 

## 2018-06-25 NOTE — Telephone Encounter (Signed)
Called [facility] and spoke to a pharmacist and gave him the verbal OK to change Advair to the generic Wixela. Pharmacist verbalized an understanding.

## 2018-06-29 DIAGNOSIS — M5417 Radiculopathy, lumbosacral region: Secondary | ICD-10-CM | POA: Diagnosis not present

## 2018-06-29 DIAGNOSIS — G894 Chronic pain syndrome: Secondary | ICD-10-CM | POA: Diagnosis not present

## 2018-06-29 DIAGNOSIS — G8929 Other chronic pain: Secondary | ICD-10-CM | POA: Diagnosis not present

## 2018-06-29 DIAGNOSIS — M5442 Lumbago with sciatica, left side: Secondary | ICD-10-CM | POA: Diagnosis not present

## 2018-06-29 DIAGNOSIS — M545 Low back pain: Secondary | ICD-10-CM | POA: Diagnosis not present

## 2018-06-29 DIAGNOSIS — M5441 Lumbago with sciatica, right side: Secondary | ICD-10-CM | POA: Diagnosis not present

## 2018-06-30 ENCOUNTER — Other Ambulatory Visit: Payer: Self-pay | Admitting: Family Medicine

## 2018-07-05 DIAGNOSIS — G8929 Other chronic pain: Secondary | ICD-10-CM | POA: Diagnosis not present

## 2018-07-05 DIAGNOSIS — M545 Low back pain: Secondary | ICD-10-CM | POA: Diagnosis not present

## 2018-07-05 DIAGNOSIS — Z978 Presence of other specified devices: Secondary | ICD-10-CM | POA: Diagnosis not present

## 2018-07-05 DIAGNOSIS — M5442 Lumbago with sciatica, left side: Secondary | ICD-10-CM | POA: Diagnosis not present

## 2018-07-14 DIAGNOSIS — R000 Tachycardia, unspecified: Secondary | ICD-10-CM | POA: Diagnosis not present

## 2018-07-14 DIAGNOSIS — Z79899 Other long term (current) drug therapy: Secondary | ICD-10-CM | POA: Diagnosis not present

## 2018-07-14 DIAGNOSIS — R531 Weakness: Secondary | ICD-10-CM | POA: Diagnosis not present

## 2018-07-14 DIAGNOSIS — F17290 Nicotine dependence, other tobacco product, uncomplicated: Secondary | ICD-10-CM | POA: Diagnosis not present

## 2018-07-14 DIAGNOSIS — Z885 Allergy status to narcotic agent status: Secondary | ICD-10-CM | POA: Diagnosis not present

## 2018-07-14 DIAGNOSIS — R42 Dizziness and giddiness: Secondary | ICD-10-CM | POA: Diagnosis not present

## 2018-07-14 DIAGNOSIS — Z791 Long term (current) use of non-steroidal anti-inflammatories (NSAID): Secondary | ICD-10-CM | POA: Diagnosis not present

## 2018-07-14 DIAGNOSIS — F329 Major depressive disorder, single episode, unspecified: Secondary | ICD-10-CM | POA: Diagnosis not present

## 2018-07-14 DIAGNOSIS — M1990 Unspecified osteoarthritis, unspecified site: Secondary | ICD-10-CM | POA: Diagnosis not present

## 2018-07-14 DIAGNOSIS — E079 Disorder of thyroid, unspecified: Secondary | ICD-10-CM | POA: Diagnosis not present

## 2018-07-14 DIAGNOSIS — J449 Chronic obstructive pulmonary disease, unspecified: Secondary | ICD-10-CM | POA: Diagnosis not present

## 2018-07-16 ENCOUNTER — Other Ambulatory Visit: Payer: Self-pay | Admitting: Family Medicine

## 2018-07-17 ENCOUNTER — Other Ambulatory Visit: Payer: Self-pay | Admitting: Family Medicine

## 2018-08-07 ENCOUNTER — Other Ambulatory Visit: Payer: Self-pay | Admitting: Family Medicine

## 2018-08-23 ENCOUNTER — Telehealth: Payer: Self-pay | Admitting: *Deleted

## 2018-08-23 NOTE — Telephone Encounter (Signed)
Copied from CRM [id number]. Topic: General - Other >> Aug 23, 2018 11:24 AM Cynthia [name] wrote: Reason for CRM: Patient is calling to request a call back that she has received her A1C, Please advise

## 2018-08-23 NOTE — Telephone Encounter (Signed)
Called patient and let her know that I did not see a Hemoglobin A1c done within the past few years. Patient thanked me.

## 2018-08-30 DIAGNOSIS — F332 Major depressive disorder, recurrent severe without psychotic features: Secondary | ICD-10-CM | POA: Diagnosis not present

## 2018-09-27 DIAGNOSIS — Z978 Presence of other specified devices: Secondary | ICD-10-CM | POA: Diagnosis not present

## 2018-09-27 DIAGNOSIS — M542 Cervicalgia: Secondary | ICD-10-CM | POA: Diagnosis not present

## 2018-09-27 DIAGNOSIS — M545 Low back pain: Secondary | ICD-10-CM | POA: Diagnosis not present

## 2018-09-27 DIAGNOSIS — Z79899 Other long term (current) drug therapy: Secondary | ICD-10-CM | POA: Diagnosis not present

## 2018-09-27 DIAGNOSIS — G894 Chronic pain syndrome: Secondary | ICD-10-CM | POA: Diagnosis not present

## 2018-09-27 DIAGNOSIS — M5417 Radiculopathy, lumbosacral region: Secondary | ICD-10-CM | POA: Diagnosis not present

## 2018-09-27 DIAGNOSIS — M5442 Lumbago with sciatica, left side: Secondary | ICD-10-CM | POA: Diagnosis not present

## 2018-09-29 ENCOUNTER — Encounter: Payer: Self-pay | Admitting: Family Medicine

## 2018-10-13 DIAGNOSIS — Z6832 Body mass index (BMI) 32.0-32.9, adult: Secondary | ICD-10-CM | POA: Diagnosis not present

## 2018-10-13 DIAGNOSIS — K219 Gastro-esophageal reflux disease without esophagitis: Secondary | ICD-10-CM | POA: Diagnosis not present

## 2018-10-13 DIAGNOSIS — M545 Low back pain: Secondary | ICD-10-CM | POA: Diagnosis not present

## 2018-10-13 DIAGNOSIS — E669 Obesity, unspecified: Secondary | ICD-10-CM | POA: Diagnosis not present

## 2018-10-13 DIAGNOSIS — Z48811 Encounter for surgical aftercare following surgery on the nervous system: Secondary | ICD-10-CM | POA: Diagnosis not present

## 2018-10-13 DIAGNOSIS — Z885 Allergy status to narcotic agent status: Secondary | ICD-10-CM | POA: Diagnosis not present

## 2018-10-13 DIAGNOSIS — F329 Major depressive disorder, single episode, unspecified: Secondary | ICD-10-CM | POA: Diagnosis not present

## 2018-10-13 DIAGNOSIS — E079 Disorder of thyroid, unspecified: Secondary | ICD-10-CM | POA: Diagnosis not present

## 2018-10-13 DIAGNOSIS — J449 Chronic obstructive pulmonary disease, unspecified: Secondary | ICD-10-CM | POA: Diagnosis not present

## 2018-10-13 DIAGNOSIS — G4730 Sleep apnea, unspecified: Secondary | ICD-10-CM | POA: Diagnosis not present

## 2018-10-13 DIAGNOSIS — G894 Chronic pain syndrome: Secondary | ICD-10-CM | POA: Diagnosis not present

## 2018-10-13 DIAGNOSIS — M1990 Unspecified osteoarthritis, unspecified site: Secondary | ICD-10-CM | POA: Diagnosis not present

## 2018-10-13 DIAGNOSIS — Z4549 Encounter for adjustment and management of other implanted nervous system device: Secondary | ICD-10-CM | POA: Diagnosis not present

## 2018-10-13 DIAGNOSIS — F17200 Nicotine dependence, unspecified, uncomplicated: Secondary | ICD-10-CM | POA: Diagnosis not present

## 2018-10-14 DIAGNOSIS — M791 Myalgia, unspecified site: Secondary | ICD-10-CM | POA: Diagnosis not present

## 2018-10-14 DIAGNOSIS — Z79891 Long term (current) use of opiate analgesic: Secondary | ICD-10-CM | POA: Diagnosis not present

## 2018-10-14 DIAGNOSIS — R069 Unspecified abnormalities of breathing: Secondary | ICD-10-CM | POA: Diagnosis not present

## 2018-10-14 DIAGNOSIS — R0602 Shortness of breath: Secondary | ICD-10-CM | POA: Diagnosis not present

## 2018-10-14 DIAGNOSIS — K219 Gastro-esophageal reflux disease without esophagitis: Secondary | ICD-10-CM | POA: Diagnosis not present

## 2018-10-14 DIAGNOSIS — F329 Major depressive disorder, single episode, unspecified: Secondary | ICD-10-CM | POA: Diagnosis not present

## 2018-10-14 DIAGNOSIS — K760 Fatty (change of) liver, not elsewhere classified: Secondary | ICD-10-CM | POA: Diagnosis not present

## 2018-10-14 DIAGNOSIS — Z9689 Presence of other specified functional implants: Secondary | ICD-10-CM | POA: Diagnosis not present

## 2018-10-14 DIAGNOSIS — R110 Nausea: Secondary | ICD-10-CM | POA: Diagnosis not present

## 2018-10-14 DIAGNOSIS — F17200 Nicotine dependence, unspecified, uncomplicated: Secondary | ICD-10-CM | POA: Diagnosis not present

## 2018-10-14 DIAGNOSIS — R52 Pain, unspecified: Secondary | ICD-10-CM | POA: Diagnosis not present

## 2018-10-14 DIAGNOSIS — R062 Wheezing: Secondary | ICD-10-CM | POA: Diagnosis not present

## 2018-10-14 DIAGNOSIS — Z7951 Long term (current) use of inhaled steroids: Secondary | ICD-10-CM | POA: Diagnosis not present

## 2018-10-14 DIAGNOSIS — F17210 Nicotine dependence, cigarettes, uncomplicated: Secondary | ICD-10-CM | POA: Diagnosis not present

## 2018-10-14 DIAGNOSIS — R918 Other nonspecific abnormal finding of lung field: Secondary | ICD-10-CM | POA: Diagnosis not present

## 2018-10-14 DIAGNOSIS — Z885 Allergy status to narcotic agent status: Secondary | ICD-10-CM | POA: Diagnosis not present

## 2018-10-14 DIAGNOSIS — Z79899 Other long term (current) drug therapy: Secondary | ICD-10-CM | POA: Diagnosis not present

## 2018-10-14 DIAGNOSIS — J449 Chronic obstructive pulmonary disease, unspecified: Secondary | ICD-10-CM | POA: Diagnosis not present

## 2018-10-14 DIAGNOSIS — M5489 Other dorsalgia: Secondary | ICD-10-CM | POA: Diagnosis not present

## 2018-10-14 DIAGNOSIS — G8918 Other acute postprocedural pain: Secondary | ICD-10-CM | POA: Diagnosis not present

## 2018-10-20 ENCOUNTER — Other Ambulatory Visit: Payer: Self-pay | Admitting: Family Medicine

## 2018-10-20 ENCOUNTER — Other Ambulatory Visit: Payer: Self-pay

## 2018-10-20 ENCOUNTER — Encounter: Payer: Self-pay | Admitting: Family Medicine

## 2018-10-20 MED ORDER — FESOTERODINE FUMARATE ER 4 MG PO TB24: 4.0000 mg | ORAL_TABLET | Freq: Every day | ORAL | 2 refills | Status: DC

## 2018-10-20 MED ORDER — FAMOTIDINE 20 MG PO TABS: 20.0000 mg | ORAL_TABLET | Freq: Two times a day (BID) | ORAL | 1 refills | Status: DC

## 2018-10-20 NOTE — Telephone Encounter (Signed)
Change to Pepcid 20 mg po bid.

## 2018-10-20 NOTE — Telephone Encounter (Signed)
This medication is no longer letting us send in a refill. Did they take off of the market? Please advise.

## 2018-10-30 ENCOUNTER — Encounter: Payer: Self-pay | Admitting: Family Medicine

## 2018-11-08 DIAGNOSIS — F332 Major depressive disorder, recurrent severe without psychotic features: Secondary | ICD-10-CM | POA: Diagnosis not present

## 2018-11-19 DIAGNOSIS — Z978 Presence of other specified devices: Secondary | ICD-10-CM | POA: Diagnosis not present

## 2018-11-19 DIAGNOSIS — M5417 Radiculopathy, lumbosacral region: Secondary | ICD-10-CM | POA: Diagnosis not present

## 2018-11-19 DIAGNOSIS — M5442 Lumbago with sciatica, left side: Secondary | ICD-10-CM | POA: Diagnosis not present

## 2018-11-19 DIAGNOSIS — G894 Chronic pain syndrome: Secondary | ICD-10-CM | POA: Diagnosis not present

## 2018-11-24 ENCOUNTER — Other Ambulatory Visit: Payer: Self-pay | Admitting: Family Medicine

## 2018-11-25 NOTE — Telephone Encounter (Signed)
Called patient and she has a follow up appointment on Monday, 11/29/18, Doxy for med refills and follow up and needs labs.

## 2018-11-29 ENCOUNTER — Other Ambulatory Visit: Payer: Self-pay

## 2018-11-29 ENCOUNTER — Ambulatory Visit: Payer: Medicare Other | Admitting: Family Medicine

## 2018-12-19 ENCOUNTER — Other Ambulatory Visit: Payer: Self-pay | Admitting: Family Medicine

## 2019-01-05 DIAGNOSIS — G894 Chronic pain syndrome: Secondary | ICD-10-CM | POA: Diagnosis not present

## 2019-01-05 DIAGNOSIS — Z79899 Other long term (current) drug therapy: Secondary | ICD-10-CM | POA: Diagnosis not present

## 2019-01-05 DIAGNOSIS — M5442 Lumbago with sciatica, left side: Secondary | ICD-10-CM | POA: Diagnosis not present

## 2019-01-05 DIAGNOSIS — M5417 Radiculopathy, lumbosacral region: Secondary | ICD-10-CM | POA: Diagnosis not present

## 2019-01-05 DIAGNOSIS — M533 Sacrococcygeal disorders, not elsewhere classified: Secondary | ICD-10-CM | POA: Diagnosis not present

## 2019-01-05 DIAGNOSIS — Z5181 Encounter for therapeutic drug level monitoring: Secondary | ICD-10-CM | POA: Diagnosis not present

## 2019-01-18 ENCOUNTER — Other Ambulatory Visit: Payer: Self-pay | Admitting: Family Medicine

## 2019-02-08 DIAGNOSIS — Z79899 Other long term (current) drug therapy: Secondary | ICD-10-CM | POA: Diagnosis not present

## 2019-02-08 DIAGNOSIS — M533 Sacrococcygeal disorders, not elsewhere classified: Secondary | ICD-10-CM | POA: Diagnosis not present

## 2019-02-08 DIAGNOSIS — G894 Chronic pain syndrome: Secondary | ICD-10-CM | POA: Diagnosis not present

## 2019-02-08 DIAGNOSIS — M542 Cervicalgia: Secondary | ICD-10-CM | POA: Diagnosis not present

## 2019-02-11 ENCOUNTER — Other Ambulatory Visit: Payer: Self-pay

## 2019-02-11 MED ORDER — ALBUTEROL SULFATE HFA 108 (90 BASE) MCG/ACT IN AERS: INHALATION_SPRAY | RESPIRATORY_TRACT | 5 refills | Status: DC

## 2019-02-16 ENCOUNTER — Other Ambulatory Visit: Payer: Self-pay | Admitting: Family Medicine

## 2019-03-12 ENCOUNTER — Other Ambulatory Visit: Payer: Self-pay | Admitting: Family Medicine

## 2019-03-13 ENCOUNTER — Other Ambulatory Visit: Payer: Self-pay | Admitting: Family Medicine

## 2019-03-23 DIAGNOSIS — M542 Cervicalgia: Secondary | ICD-10-CM | POA: Diagnosis not present

## 2019-03-23 DIAGNOSIS — G894 Chronic pain syndrome: Secondary | ICD-10-CM | POA: Diagnosis not present

## 2019-03-23 DIAGNOSIS — Z978 Presence of other specified devices: Secondary | ICD-10-CM | POA: Diagnosis not present

## 2019-03-23 DIAGNOSIS — M533 Sacrococcygeal disorders, not elsewhere classified: Secondary | ICD-10-CM | POA: Diagnosis not present

## 2019-03-23 DIAGNOSIS — M545 Low back pain: Secondary | ICD-10-CM | POA: Diagnosis not present

## 2019-03-31 ENCOUNTER — Other Ambulatory Visit: Payer: Self-pay | Admitting: Family Medicine

## 2019-04-01 NOTE — Telephone Encounter (Signed)
Called and left a detailed voice message to let patient know that she needs to schedule an appointment with labs for further refills.   OK for PEC to discuss, advise, and/or schedule an appointment for patient.  CRM Created.

## 2019-04-06 ENCOUNTER — Other Ambulatory Visit: Payer: Self-pay | Admitting: Family Medicine

## 2019-04-08 ENCOUNTER — Encounter: Payer: Self-pay | Admitting: Family Medicine

## 2019-04-08 ENCOUNTER — Other Ambulatory Visit: Payer: Self-pay

## 2019-04-08 ENCOUNTER — Ambulatory Visit (INDEPENDENT_AMBULATORY_CARE_PROVIDER_SITE_OTHER): Payer: Medicare Other | Admitting: Family Medicine

## 2019-04-08 VITALS — BP 120/72 | HR 104 | Temp 99.0°F | Wt 212.6 lb

## 2019-04-08 DIAGNOSIS — Z23 Encounter for immunization: Secondary | ICD-10-CM

## 2019-04-08 DIAGNOSIS — B20 Human immunodeficiency virus [HIV] disease: Secondary | ICD-10-CM | POA: Diagnosis not present

## 2019-04-08 DIAGNOSIS — G894 Chronic pain syndrome: Secondary | ICD-10-CM

## 2019-04-08 DIAGNOSIS — Z9189 Other specified personal risk factors, not elsewhere classified: Secondary | ICD-10-CM

## 2019-04-08 DIAGNOSIS — R3915 Urgency of urination: Secondary | ICD-10-CM | POA: Diagnosis not present

## 2019-04-08 DIAGNOSIS — J449 Chronic obstructive pulmonary disease, unspecified: Secondary | ICD-10-CM

## 2019-04-08 DIAGNOSIS — E039 Hypothyroidism, unspecified: Secondary | ICD-10-CM

## 2019-04-08 DIAGNOSIS — J430 Unilateral pulmonary emphysema [MacLeod's syndrome]: Secondary | ICD-10-CM | POA: Diagnosis not present

## 2019-04-08 NOTE — Progress Notes (Signed)
  Subjective:     Patient ID: Cynthia [name], female   DOB: [birth year], 63 y.o.   MRN: [id number]  HPI Cynthia [name] is seen for medical follow-up.  She had very difficult situation iin that she was living out of her car for most the past year.  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  She is very content with her current work situation  Her chronic problems include history of COPD, ongoing nicotine use, obstructive sleep apnea, hypothyroidism, bipolar disorder, obesity, chronic pain syndrome.  She is followed by psychiatrist as well as pain management.  She is on a long list of medications.  She is requesting refills of Toviaz and her thyroid medication.  She has hypothyroidism and is overdue for labs.  Urine urgency symptoms are stable.  No recent burning with urination.  She does need flu vaccine and also has not had history of documented Pneumovax.  She states she has some chronic wheezing related to her COPD which never ceases.  She is still smoking half pack cigarettes per day.  Past Medical History:  Diagnosis Date  . Bipolar disorder ([place])   . COPD (chronic obstructive pulmonary disease) ([place])   . Depression   . GERD (gastroesophageal reflux disease)    Past Surgical History:  Procedure Laterality Date  . APPENDECTOMY    . CHOLECYSTECTOMY    . LUMBAR LAMINECTOMY     6 surgeries    reports that she has been smoking cigarettes. She has never used smokeless tobacco. She reports that she does not drink alcohol or use drugs. family history includes Alzheimer's disease in her mother; Colon cancer in her father. Allergies  Allergen Reactions  . Codeine Phosphate Nausea And Vomiting      Review of Systems  Constitutional: Negative for chills, fatigue and fever.  Eyes: Negative for visual disturbance.  Respiratory: Positive for wheezing. Negative for cough, chest tightness and shortness of breath.    Cardiovascular: Negative for chest pain, palpitations and leg swelling.  Neurological: Negative for dizziness, seizures, syncope, weakness, light-headedness and headaches.       Objective:   Physical Exam Vitals signs reviewed.  Constitutional:      Appearance: Normal appearance.  Neck:     Musculoskeletal: Neck supple.  Cardiovascular:     Rate and Rhythm: Normal rate and regular rhythm.  Pulmonary:     Effort: Pulmonary effort is normal. No respiratory distress.     Breath sounds: Wheezing present. No rales.  Musculoskeletal:     Right lower leg: No edema.     Left lower leg: No edema.  Lymphadenopathy:     Cervical: No cervical adenopathy.        Assessment:     #1 hypothyroidism.  Due for follow-up labs  #2 urinary urgency.  Patient requesting refills of Toviaz  #3 COPD with ongoing nicotine use    Plan:     -Flu vaccine and Pneumovax given -She is strongly encouraged to discontinue smoking -Refill Toviaz and thyroid medication for 1 year -Check TSH -Routine follow-up in 6 months and sooner as needed  Cynthia Post MD [facility] at [facility]

## 2019-04-08 NOTE — Addendum Note (Signed)
Addended by: Wynn Banker H on: 04/08/2019 04:00 PM   Modules accepted: Orders

## 2019-04-09 ENCOUNTER — Encounter: Payer: Self-pay | Admitting: Family Medicine

## 2019-04-09 LAB — TSH: TSH: 3.42 mIU/L (ref 0.40–4.50)

## 2019-04-12 ENCOUNTER — Encounter: Payer: Self-pay | Admitting: Family Medicine

## 2019-04-12 MED ORDER — LEVOTHYROXINE SODIUM 50 MCG PO TABS: ORAL_TABLET | ORAL | 1 refills | Status: DC

## 2019-04-12 NOTE — Telephone Encounter (Signed)
Please advise. Cynthia [name] for patient to have a TB test?

## 2019-04-13 ENCOUNTER — Other Ambulatory Visit: Payer: Self-pay | Admitting: Family Medicine

## 2019-05-06 ENCOUNTER — Ambulatory Visit: Payer: Self-pay | Admitting: *Deleted

## 2019-05-06 NOTE — Telephone Encounter (Signed)
Pt called in and stated that her lips have been numb since about 3. No swelling or no other symptoms. It is not getting any worse , just numb.  She stated this as never happened before  Best number [phone]   Returned call to patient. She stated her lips have been numb since 3 pm today. Have not gotten any worse. Denies swelling or pain. No bug bite or reddness. Has not been started on any new medication or had any new food. She denies headache, weakness, or dizziness or any other symptoms. Advised to go to an urgent care to be examine. Pt refuses, if she gets worse then she will go. Routing to [facility] at [place] for review.  Reason for Disposition . [1] Numbness (i.e., loss of sensation) of the face, arm / hand, or leg / foot on one side of the body AND [2] gradual onset (e.g., days to weeks) AND [3] present now  Answer Assessment - Initial Assessment Questions 1. SYMPTOM: "What is the main symptom you are concerned about?" (e.g., weakness, numbness)     Numbness in lips 2. ONSET: "When did this start?" (minutes, hours, days; while sleeping)     Since 3 pm today 3. LAST NORMAL: "When was the last time you were normal (no symptoms)?"     Before 3 pm 4. PATTERN "Does this come and go, or has it been constant since it started?"  "Is it present now?"     constant 5. CARDIAC SYMPTOMS: "Have you had any of the following symptoms: chest pain, difficulty breathing, palpitations?"     no 6. NEUROLOGIC SYMPTOMS: "Have you had any of the following symptoms: headache, dizziness, vision loss, double vision, changes in speech, unsteady on your feet?"     no 7. OTHER SYMPTOMS: "Do you have any other symptoms?"     no 8. PREGNANCY: "Is there any chance you are pregnant?" "When was your last menstrual period?"     n/a  Protocols used: NEUROLOGIC DEFICIT-A-AH

## 2019-05-09 NOTE — Telephone Encounter (Signed)
Message routed to PCP CMA  

## 2019-05-10 NOTE — Telephone Encounter (Signed)
Spoke with pt and went over Dr. Erick Blinks message. Pt understood and had no additional questions at this time.

## 2019-05-10 NOTE — Telephone Encounter (Signed)
Watch for lip swelling, facial weakness, slurred speech, confusion.   If any of these noted or symptoms persist needs to be seen.

## 2019-05-15 ENCOUNTER — Encounter: Payer: Self-pay | Admitting: Family Medicine

## 2019-05-19 ENCOUNTER — Other Ambulatory Visit: Payer: Self-pay | Admitting: *Deleted

## 2019-05-19 MED ORDER — AEROCHAMBER MV MISC: 2 refills | Status: DC

## 2019-06-06 ENCOUNTER — Encounter: Payer: Self-pay | Admitting: Family Medicine

## 2019-06-08 ENCOUNTER — Other Ambulatory Visit: Payer: Self-pay

## 2019-06-15 ENCOUNTER — Ambulatory Visit (INDEPENDENT_AMBULATORY_CARE_PROVIDER_SITE_OTHER): Payer: Medicare Other | Admitting: Family Medicine

## 2019-06-15 ENCOUNTER — Encounter: Payer: Self-pay | Admitting: Family Medicine

## 2019-06-15 ENCOUNTER — Other Ambulatory Visit: Payer: Self-pay

## 2019-06-15 DIAGNOSIS — M19042 Primary osteoarthritis, left hand: Secondary | ICD-10-CM | POA: Diagnosis not present

## 2019-06-15 DIAGNOSIS — M19041 Primary osteoarthritis, right hand: Secondary | ICD-10-CM

## 2019-06-15 MED ORDER — DICLOFENAC SODIUM 1 % EX GEL: 2.0000 g | Freq: Four times a day (QID) | CUTANEOUS | 3 refills | Status: DC

## 2019-06-15 NOTE — Progress Notes (Signed)
  Subjective:     Patient ID: Cynthia [name], female   DOB: 1955/12/26, 63 y.o.   MRN: [id number]  HPI Ms. Philbrick is seen with bilateral hand pain. She has noticed some deformities and nodular changes involving DIP joints especially of the index and middle fingers bilaterally. She also has significant pain MCP joint of each thumb. Left is slightly worse than right. She is right-hand dominant.  She has noted that her pain seems to be worse when her hands are cold. They frequently feel achy. She has not had any generalized arthritis or any visible signs of inflammation such as redness or warmth.  Her chronic problems include ongoing nicotine use, COPD, obstructive sleep apnea, hypothyroidism, chronic pain syndrome, bipolar disorder. She is followed by pain management and on several pain meds including Nucynta.  Past Medical History:  Diagnosis Date  . Bipolar disorder ([place])   . COPD (chronic obstructive pulmonary disease) ([place])   . Depression   . GERD (gastroesophageal reflux disease)    Past Surgical History:  Procedure Laterality Date  . APPENDECTOMY    . CHOLECYSTECTOMY    . LUMBAR LAMINECTOMY     6 surgeries    reports that she has been smoking cigarettes. She has never used smokeless tobacco. She reports that she does not drink alcohol or use drugs. family history includes Alzheimer's disease in her mother; Colon cancer in her father. Allergies  Allergen Reactions  . Codeine Phosphate Nausea And Vomiting     Review of Systems  Constitutional: Negative for chills and fever.  Respiratory: Negative for shortness of breath.   Cardiovascular: Negative for chest pain.  Musculoskeletal: Positive for arthralgias.  Hematological: Negative for adenopathy.       Objective:   Physical Exam Vitals signs reviewed.  Constitutional:      Appearance: Normal appearance.  Cardiovascular:     Rate and Rhythm: Normal rate and regular rhythm.  Pulmonary:     Effort: Pulmonary effort  is normal.  Musculoskeletal:     Comments: She has clinical findings of some nodules especially index and middle finger both hands and some bony hypertrophy involving DIP and PIP joints of several digits. No warmth. No erythema. No effusions  Neurological:     Mental Status: She is alert.        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 -We recommend she try to keep hands warm and consider gloves even sometimes indoors during the winter if her hands are cool -Discussed trial of diclofenac 1% gel to use 3-4 times daily as needed -She will consider over-the-counter glucosamine  Eulas Post MD [facility] at [facility]

## 2019-06-15 NOTE — Patient Instructions (Signed)

## 2019-06-30 ENCOUNTER — Other Ambulatory Visit: Payer: Self-pay | Admitting: Family Medicine

## 2019-07-19 ENCOUNTER — Other Ambulatory Visit: Payer: Self-pay | Admitting: Family Medicine

## 2019-07-24 ENCOUNTER — Other Ambulatory Visit: Payer: Self-pay | Admitting: Family Medicine

## 2019-08-12 ENCOUNTER — Other Ambulatory Visit: Payer: Self-pay

## 2019-08-12 ENCOUNTER — Telehealth: Payer: Self-pay | Admitting: Family Medicine

## 2019-08-12 ENCOUNTER — Telehealth (INDEPENDENT_AMBULATORY_CARE_PROVIDER_SITE_OTHER): Payer: Medicare Other | Admitting: Family Medicine

## 2019-08-12 DIAGNOSIS — R110 Nausea: Secondary | ICD-10-CM | POA: Diagnosis not present

## 2019-08-12 DIAGNOSIS — R519 Headache, unspecified: Secondary | ICD-10-CM

## 2019-08-12 DIAGNOSIS — R52 Pain, unspecified: Secondary | ICD-10-CM

## 2019-08-12 MED ORDER — ONDANSETRON 8 MG PO TBDP: 8.0000 mg | ORAL_TABLET | Freq: Three times a day (TID) | ORAL | 0 refills | Status: DC | PRN

## 2019-08-12 NOTE — Progress Notes (Signed)
This visit type was conducted due to national recommendations for restrictions regarding the COVID-19 pandemic in an effort to limit this patient's exposure and mitigate transmission in our community.   Virtual Visit via Video Note  I connected with Cynthia [name] on 08/12/19 at  2:15 PM EST by a video enabled telemedicine application and verified that I am speaking with the correct person using two identifiers.  Location patient: home Location provider:work or home office Persons participating in the virtual visit: patient, provider  I discussed the limitations of evaluation and management by telemedicine and the availability of in person appointments. The patient expressed understanding and agreed to proceed.   HPI:  She is seen with 1 day history of diffuse body aches, fatigue, headache, possible low-grade fever.  She feels achy generally all over.  She has not taken her temperature.  Only rare cough.  Some mild loss of taste.  She had some nausea without vomiting.  No diarrhea.  No dyspnea.  No sick contacts.  Denies any dysuria.  No localizing abdominal pain.    ROS: See pertinent positives and negatives per HPI.  Past Medical History:  Diagnosis Date  . Bipolar disorder ([place])   . COPD (chronic obstructive pulmonary disease) ([place])   . Depression   . GERD (gastroesophageal reflux disease)     Past Surgical History:  Procedure Laterality Date  . APPENDECTOMY    . CHOLECYSTECTOMY    . LUMBAR LAMINECTOMY     6 surgeries    Family History  Problem Relation Age of Onset  . Alzheimer's disease Mother   . Colon cancer Father     SOCIAL HX: Current smoker.  No alcohol.  Currently lives with someone as their primary caregiver   Current Outpatient Medications:  .  ADVAIR DISKUS 100-50 MCG/DOSE AEPB, INHALE 1 PUFF INTO THE LUNGS 2 TIMES DAILY AT 10 AM AND 5 PM., Disp: 60 each, Rfl: 1 .  albuterol (VENTOLIN HFA) 108 (90 Base) MCG/ACT inhaler, INHALE 2 PUFFS BY MOUTH EVERY 4  HOURS AS NEEDED FOR WHEEZE OR FOR SHORTNESS OF BREATH, Disp: 18 g, Rfl: 4 .  amphetamine-dextroamphetamine (ADDERALL) 20 MG tablet, Take 20 mg by mouth 2 (two) times daily. , Disp: , Rfl:  .  ARIPiprazole (ABILIFY) 5 MG tablet, Take 5 mg by mouth every morning., Disp: , Rfl: 5 .  diclofenac Sodium (VOLTAREN) 1 % GEL, Apply 2 g topically 4 (four) times daily., Disp: 100 g, Rfl: 3 .  famotidine (PEPCID) 20 MG tablet, TAKE 1 TABLET BY MOUTH TWICE A DAY, Disp: 180 tablet, Rfl: 1 .  HYDROmorphone, PF, (DILAUDID HP) 250 MG injection, Inject as directed. PT CURRENTLY WEARS A PAIN PUMP., Disp: , Rfl:  .  hydrOXYzine (VISTARIL) 50 MG capsule, Take 50 mg by mouth daily. , Disp: , Rfl: 0 .  INCRUSE ELLIPTA 62.5 MCG/INH AEPB, INHALE 1 PUFF INTO THE LUNGS DAILY, Disp: 30 each, Rfl: 10 .  levothyroxine (SYNTHROID) 50 MCG tablet, Take one tablet by mouth daily., Disp: 90 tablet, Rfl: 1 .  mirtazapine (REMERON) 30 MG tablet, Take 1 tablet by mouth daily., Disp: , Rfl:  .  naloxone (NARCAN) nasal spray 4 mg/0.1 mL, Place 1 spray into the nose once., Disp: , Rfl:  .  NUCYNTA 75 MG tablet, Take 1 tablet by mouth 2 (two) times daily., Disp: , Rfl: 0 .  ondansetron (ZOFRAN ODT) 8 MG disintegrating tablet, Take 1 tablet (8 mg total) by mouth every 8 (eight) hours as needed for  nausea or vomiting., Disp: 15 tablet, Rfl: 0 .  Spacer/Aero-Holding Chambers (AEROCHAMBER MV) inhaler, Use with MDI as needed, Disp: 1 each, Rfl: 2 .  TOVIAZ 4 MG TB24 tablet, TAKE 1 TABLET BY MOUTH DAILY., Disp: 90 tablet, Rfl: 0 .  venlafaxine XR (EFFEXOR-XR) 150 MG 24 hr capsule, 150 mg. Two tablets in the morning, Disp: , Rfl:   EXAM:  VITALS per patient if applicable:  GENERAL: alert, oriented, appears well and in no acute distress  HEENT: atraumatic, conjunttiva clear, no obvious abnormalities on inspection of external nose and ears  NECK: normal movements of the head and neck  LUNGS: on inspection no signs of respiratory distress,  breathing rate appears normal, no obvious gross SOB, gasping or wheezing  CV: no obvious cyanosis  MS: moves all visible extremities without noticeable abnormality  PSYCH/NEURO: pleasant and cooperative, no obvious depression or anxiety, speech and thought processing grossly intact  ASSESSMENT AND PLAN:  Discussed the following assessment and plan:  Presents with 1 day history of nonspecific symptoms including myalgias, bilateral headache, fatigue, possible low-grade fever, and some loss of taste.  Symptoms suspicious for possible Covid.  She does not appear septic or toxic at this time.  We recommend the following  -Strict quarantine until further tested and plenty of fluids and rest.  -Recommend Covid testing.  She is currently living over in [place] and she will check on availability of testing there  -Send in some Zofran 8 mg ODT 1 every 8 hours as needed for nausea and vomiting  -Go to the ER or follow-up immediately for any increased shortness of breath or other concerns     I discussed the assessment and treatment plan with the patient. The patient was provided an opportunity to ask questions and all were answered. The patient agreed with the plan and demonstrated an understanding of the instructions.   The patient was advised to call back or seek an in-person evaluation if the symptoms worsen or if the condition fails to improve as anticipated.     Evelena Peat, MD

## 2019-08-12 NOTE — Telephone Encounter (Signed)
I have scheduled her a Doxy for today at 2:15pm so you can discuss.

## 2019-08-12 NOTE — Telephone Encounter (Signed)
Pt is requesting to speak to you regarding Covid symptoms she has. Pt has bodyaches, terrible headache, and a possible fever. She would like to know what can she do? Thanks  Pt's # [phone]

## 2019-08-24 ENCOUNTER — Encounter: Payer: Self-pay | Admitting: Family Medicine

## 2019-08-27 ENCOUNTER — Other Ambulatory Visit: Payer: Self-pay | Admitting: Family Medicine

## 2019-09-24 ENCOUNTER — Other Ambulatory Visit: Payer: Self-pay | Admitting: Family Medicine

## 2019-09-27 ENCOUNTER — Other Ambulatory Visit: Payer: Self-pay | Admitting: Family Medicine

## 2019-09-28 ENCOUNTER — Other Ambulatory Visit: Payer: Self-pay | Admitting: Family Medicine

## 2019-10-14 ENCOUNTER — Other Ambulatory Visit: Payer: Self-pay | Admitting: Family Medicine

## 2019-11-04 ENCOUNTER — Other Ambulatory Visit: Payer: Self-pay | Admitting: Family Medicine

## 2019-11-07 ENCOUNTER — Other Ambulatory Visit: Payer: Self-pay | Admitting: Family Medicine

## 2019-11-10 ENCOUNTER — Telehealth: Payer: Self-pay | Admitting: Family Medicine

## 2019-11-10 NOTE — Telephone Encounter (Signed)
Patient having trouble catching her breath. She said that it started about a week age.  I sent the patient to our nurse triage line.

## 2019-11-10 NOTE — Telephone Encounter (Signed)
Patient called back and I spoke with Asher Muir and she suggested for the patient to go to the ED or the nearest Urgent care.   The patient stated that she doesn't have a car.

## 2019-11-22 ENCOUNTER — Ambulatory Visit: Payer: Medicare Other | Admitting: Family Medicine

## 2019-11-23 ENCOUNTER — Ambulatory Visit: Payer: Medicaid Other | Admitting: Family Medicine

## 2019-12-06 ENCOUNTER — Other Ambulatory Visit: Payer: Self-pay

## 2019-12-07 ENCOUNTER — Ambulatory Visit: Payer: Medicare Other | Admitting: Family Medicine

## 2019-12-13 ENCOUNTER — Ambulatory Visit: Payer: Medicare Other | Admitting: Family Medicine

## 2019-12-16 ENCOUNTER — Ambulatory Visit: Payer: Medicare Other | Admitting: Family Medicine

## 2019-12-22 NOTE — Telephone Encounter (Signed)
Pt called back and I changed appt to a Mychart video. Pt does not want to be transferred to Triage Nurse.

## 2019-12-23 ENCOUNTER — Telehealth: Payer: Medicare Other | Admitting: Family Medicine

## 2019-12-28 ENCOUNTER — Telehealth: Payer: Self-pay | Admitting: Family Medicine

## 2019-12-28 NOTE — Telephone Encounter (Signed)
Pt requested appt for sporadic trouble catching breathe. Called and left a vm to speak to triage.

## 2020-01-06 ENCOUNTER — Telehealth (INDEPENDENT_AMBULATORY_CARE_PROVIDER_SITE_OTHER): Payer: Medicare Other | Admitting: Family Medicine

## 2020-01-06 ENCOUNTER — Encounter: Payer: Self-pay | Admitting: Family Medicine

## 2020-01-06 ENCOUNTER — Other Ambulatory Visit: Payer: Self-pay | Admitting: Family Medicine

## 2020-01-06 DIAGNOSIS — J449 Chronic obstructive pulmonary disease, unspecified: Secondary | ICD-10-CM | POA: Diagnosis not present

## 2020-01-06 DIAGNOSIS — N3941 Urge incontinence: Secondary | ICD-10-CM | POA: Diagnosis not present

## 2020-01-06 MED ORDER — ADVAIR DISKUS 100-50 MCG/DOSE IN AEPB: 1.0000 | INHALATION_SPRAY | Freq: Two times a day (BID) | RESPIRATORY_TRACT | 11 refills | Status: DC

## 2020-01-06 MED ORDER — FESOTERODINE FUMARATE ER 4 MG PO TB24: 4.0000 mg | ORAL_TABLET | Freq: Every day | ORAL | 3 refills | Status: DC

## 2020-01-06 MED ORDER — ALBUTEROL SULFATE HFA 108 (90 BASE) MCG/ACT IN AERS: INHALATION_SPRAY | RESPIRATORY_TRACT | 4 refills | Status: DC

## 2020-01-06 NOTE — Progress Notes (Signed)
This visit type was conducted due to national recommendations for restrictions regarding the COVID-19 pandemic in an effort to limit this patient's exposure and mitigate transmission in our community.   Virtual Visit via Telephone Note  I connected with Cynthia [name] on 01/06/20 at 10:00 AM EDT by telephone and verified that I am speaking with the correct person using two identifiers.   I discussed the limitations, risks, security and privacy concerns of performing an evaluation and management service by telephone and the availability of in person appointments. I also discussed with the patient that there may be a patient responsible charge related to this service. The patient expressed understanding and agreed to proceed.  Location patient: home Location provider: work or home office Participants present for the call: patient, provider Patient did not have a visit in the prior 7 days to address this/these issue(s).   History of Present Illness:  Tine called with increasing shortness of breath both at rest but predominantly with activity over the past several weeks.  She does have history of known COPD.  She still smokes half pack cigarettes per day but is trying to cut back.  She is on combination therapy with Advair and Incruse inhaler.  She is using her Incruse inhaler regularly but ran out of Advair several weeks ago.  She also does not have a rescue inhaler.  She denies any increased cough.  No fever.  No chest pains.  She also has history of obstructive sleep apnea but cannot tolerate CPAP.  Denies any major daytime somnolence.  No history of anemia.  She has history of urine urgency and requesting refills of Toviaz.  Denies any major adverse side effects with this medication  Past Medical History:  Diagnosis Date  . Bipolar disorder ([place])   . COPD (chronic obstructive pulmonary disease) ([place])   . Depression   . GERD (gastroesophageal reflux disease)    Past Surgical History:   Procedure Laterality Date  . APPENDECTOMY    . CHOLECYSTECTOMY    . LUMBAR LAMINECTOMY     6 surgeries    reports that she has been smoking cigarettes. She has never used smokeless tobacco. She reports that she does not drink alcohol and does not use drugs. family history includes Alzheimer's disease in her mother; Colon cancer in her father. Allergies  Allergen Reactions  . Codeine Phosphate Nausea And Vomiting      Observations/Objective: Patient sounds cheerful and well on the phone. I do not appreciate any SOB. Speech and thought processing are grossly intact. Patient reported vitals:  Assessment and Plan:  #1 dyspnea.  Likely on the basis of COPD.  Worsened over past several weeks.  She denies any chest pains.  She is out of inhalers as above  -Refill Advair to use 1 puff twice daily and continue regular use of Incruse inhaler.  We also refilled her albuterol to use as needed -If dyspnea not greatly improved in 1 to 2 weeks we recommend an office follow-up for further evaluation  #2 urinary urgency -Refill Toviaz for daily use  Follow Up Instructions:  -As above   99441 5-10 99442 11-20 99443 21-30 I did not refer this patient for an OV in the next 24 hours for this/these issue(s).  I discussed the assessment and treatment plan with the patient. The patient was provided an opportunity to ask questions and all were answered. The patient agreed with the plan and demonstrated an understanding of the instructions.   The patient was advised  to call back or seek an in-person evaluation if the symptoms worsen or if the condition fails to improve as anticipated.  I provided 23 minutes of non-face-to-face time during this encounter.   Evelena Peat, MD

## 2020-01-06 NOTE — Telephone Encounter (Signed)
Please advise if change okay

## 2020-01-07 ENCOUNTER — Encounter: Payer: Self-pay | Admitting: Family Medicine

## 2020-01-08 NOTE — Telephone Encounter (Signed)
Would deny.   She requested refill of Toviaz and should not be on both medications.

## 2020-01-22 ENCOUNTER — Other Ambulatory Visit: Payer: Self-pay | Admitting: Family Medicine

## 2020-01-27 ENCOUNTER — Other Ambulatory Visit: Payer: Self-pay

## 2020-01-27 ENCOUNTER — Ambulatory Visit (INDEPENDENT_AMBULATORY_CARE_PROVIDER_SITE_OTHER): Payer: Medicare Other | Admitting: Family Medicine

## 2020-01-27 ENCOUNTER — Encounter: Payer: Self-pay | Admitting: Family Medicine

## 2020-01-27 VITALS — BP 136/72 | HR 112 | Temp 99.8°F | Wt 228.9 lb

## 2020-01-27 DIAGNOSIS — J441 Chronic obstructive pulmonary disease with (acute) exacerbation: Secondary | ICD-10-CM | POA: Diagnosis not present

## 2020-01-27 MED ORDER — FLUTICASONE-SALMETEROL 250-50 MCG/DOSE IN AEPB: 1.0000 | INHALATION_SPRAY | Freq: Two times a day (BID) | RESPIRATORY_TRACT | 11 refills | Status: DC

## 2020-01-27 MED ORDER — METHYLPREDNISOLONE ACETATE 80 MG/ML IJ SUSP
80.0000 mg | Freq: Once | INTRAMUSCULAR | Status: AC
  Administered 2020-01-27: 80 mg via INTRAMUSCULAR

## 2020-01-27 NOTE — Patient Instructions (Signed)
Chronic Obstructive Pulmonary Disease Exacerbation  Chronic obstructive pulmonary disease (COPD) is a long-term (chronic) condition that affects the lungs. COPD is a general term that can be used to describe many different lung problems that cause lung swelling (inflammation) and limit airflow, including chronic bronchitis and emphysema. COPD exacerbations are episodes when breathing symptoms become much worse and require extra treatment. COPD exacerbations are usually caused by infections. Without treatment, COPD exacerbations can be severe and even life threatening. Frequent COPD exacerbations can cause further damage to the lungs. What are the causes? This condition may be caused by:  Respiratory infections, including viral and bacterial infections.  Exposure to smoke.  Exposure to air pollution, chemical fumes, or dust.  Things that give you an allergic reaction (allergens).  Not taking your usual COPD medicines as directed.  Underlying medical problems, such as congestive heart failure or infections not involving the lungs. In many cases, the cause (trigger) of this condition is not known. What increases the risk? The following factors may make you more likely to develop this condition:  Smoking cigarettes.  Old age.  Frequent prior COPD exacerbations. What are the signs or symptoms? Symptoms of this condition include:  Increased coughing.  Increased production of mucus from your lungs (sputum).  Increased wheezing.  Increased shortness of breath.  Rapid or labored breathing.  Chest tightness.  Less energy than usual.  Sleep disruption from symptoms.  Confusion or increased sleepiness. Often these symptoms happen or get worse even with the use of medicines. How is this diagnosed? This condition is diagnosed based on:  Your medical history.  A physical exam. You may also have tests, including:  A chest X-ray.  Blood tests.  Lung (pulmonary) function  tests. How is this treated? Treatment for this condition depends on the severity and cause of the symptoms. You may need to be admitted to a hospital for treatment. Some of the treatments commonly used to treat COPD exacerbations are:  Antibiotic medicines. These may be used for severe exacerbations caused by a lung infection, such as pneumonia.  Bronchodilators. These are inhaled medicines that expand the air passages and allow increased airflow.  Steroid medicines. These act to reduce inflammation in the airways. They may be given with an inhaler, taken by mouth, or given through an IV tube inserted into one of your veins.  Supplemental oxygen therapy.  Airway clearing techniques, such as noninvasive ventilation (NIV) and positive expiratory pressure (PEP). These provide respiratory support through a mask or other noninvasive device. An example of this would be using a continuous positive airway pressure (CPAP) machine to improve delivery of oxygen into your lungs. Follow these instructions at home: Medicines  Take over-the-counter and prescription medicines only as told by your health care provider. It is important to use correct technique with inhaled medicines.  If you were prescribed an antibiotic medicine or oral steroid, take it as told by your health care provider. Do not stop taking the medicine even if you start to feel better. Lifestyle  Eat a healthy diet.  Exercise regularly.  Get plenty of sleep.  Avoid exposure to all substances that irritate the airway, especially to tobacco smoke.  Wash your hands often with soap and water to reduce the risk of infection. If soap and water are not available, use hand sanitizer.  During flu season, avoid enclosed spaces that are crowded with people. General instructions  Drink enough fluid to keep your urine clear or pale yellow (unless you have a medical   condition that requires fluid restriction).  Use a cool mist vaporizer. This  humidifies the air and makes it easier for you to clear your chest when you cough.  If you have a home nebulizer and oxygen, continue to use them as told by your health care provider.  Keep all follow-up visits as told by your health care provider. This is important. How is this prevented?  Stay up-to-date on pneumococcal and influenza (flu) vaccines. A flu shot is recommended every year to help prevent exacerbations.  Do not use any products that contain nicotine or tobacco, such as cigarettes and e-cigarettes. Quitting smoking is very important in preventing COPD from getting worse and in preventing exacerbations from happening as often. If you need help quitting, ask your health care provider.  Follow all instructions for pulmonary rehabilitation after a recent exacerbation. This can help prevent future exacerbations.  Work with your health care provider to develop and follow an action plan. This tells you what steps to take when you experience certain symptoms. Contact a health care provider if:  You have a worsening of your regular COPD symptoms. Get help right away if:  You have worsening shortness of breath, even when resting.  You have trouble talking.  You have severe chest pain.  You cough up blood.  You have a fever.  You have weakness, vomit repeatedly, or faint.  You feel confused.  You are not able to sleep because of your symptoms.  You have trouble doing daily activities. Summary  COPD exacerbations are episodes when breathing symptoms become much worse and require extra treatment above your normal treatment.  Exacerbations can be severe and even life threatening. Frequent COPD exacerbations can cause further damage to your lungs.  COPD exacerbations are usually triggered by infections such as the flu, colds, and even pneumonia.  Treatment for this condition depends on the severity and cause of the symptoms. You may need to be admitted to a hospital for  treatment.  Quitting smoking is very important to prevent COPD from getting worse and to prevent exacerbations from happening as often. This information is not intended to replace advice given to you by your health care provider. Make sure you discuss any questions you have with your health care provider. Document Revised: 06/12/2017 Document Reviewed: 08/04/2016 Elsevier Patient Education  2020 [employer]  

## 2020-01-27 NOTE — Progress Notes (Signed)
Established Patient Office Visit  Subjective:  Patient ID: Cynthia [name], female    DOB: 1955-12-05  Age: 64 y.o. MRN: [id number]  CC:  Chief Complaint  Patient presents with  . Shortness of Breath    has continued and wheezing     HPI Cynthia [name] presents for 1 month history of increased wheezing and shortness of breath.  She has known COPD.  She still smokes but has cut back to 1/2 pack cigarettes per day.  She is currently on Advair 100/50   1 puff twice daily, Incruse inhaler 1 puff daily, and albuterol intermittently as rescue inhaler.  She has had relatively rare cough but has had relatively continuous wheezing over the past month.  No fever.  Compliant with medications.  Denies any peripheral edema.  No chest pains.  No pleuritic pain.  She has getting some benefit with her rescue inhaler.  Her other medical problems include history of obstructive sleep apnea, obesity, hypothyroidism, bipolar disorder.  Currently living over in [facility].  She has her own apartment now.  Currently looking for work.  Past Medical History:  Diagnosis Date  . Bipolar disorder (HCC)   . COPD (chronic obstructive pulmonary disease) (HCC)   . Depression   . GERD (gastroesophageal reflux disease)     Past Surgical History:  Procedure Laterality Date  . APPENDECTOMY    . CHOLECYSTECTOMY    . LUMBAR LAMINECTOMY     6 surgeries    Family History  Problem Relation Age of Onset  . Alzheimer's disease Mother   . Colon cancer Father     Social History   Socioeconomic History  . Marital status: Divorced    Spouse name: Not on file  . Number of children: Not on file  . Years of education: Not on file  . Highest education level: Not on file  Occupational History  . Not on file  Tobacco Use  . Smoking status: Current Every Day Smoker    Types: Cigarettes  . Smokeless tobacco: Never Used  Vaping Use  . Vaping Use: Never used  Substance and Sexual Activity  .  Alcohol use: No  . Drug use: No  . Sexual activity: Never  Other Topics Concern  . Not on file  Social History Narrative  . Not on file   Social Determinants of Health   Financial Resource Strain:   . Difficulty of Paying Living Expenses:   Food Insecurity:   . Worried About Programme researcher, broadcasting/film/video in the Last Year:   . Barista in the Last Year:   Transportation Needs:   . Freight forwarder (Medical):   Marland Kitchen Lack of Transportation (Non-Medical):   Physical Activity:   . Days of Exercise per Week:   . Minutes of Exercise per Session:   Stress:   . Feeling of Stress :   Social Connections:   . Frequency of Communication with Friends and Family:   . Frequency of Social Gatherings with Friends and Family:   . Attends Religious Services:   . Active Member of Clubs or Organizations:   . Attends Banker Meetings:   Marland Kitchen Marital Status:   Intimate Partner Violence:   . Fear of Current or Ex-Partner:   . Emotionally Abused:   Marland Kitchen Physically Abused:   . Sexually Abused:     Outpatient Medications Prior to Visit  Medication Sig Dispense Refill  . albuterol (VENTOLIN HFA) 108 (90 Base)  MCG/ACT inhaler INHALE 2 PUFFS BY MOUTH EVERY 4 HOURS AS NEEDED FOR WHEEZE OR FOR SHORTNESS OF BREATH 18 g 4  . amphetamine-dextroamphetamine (ADDERALL) 20 MG tablet Take 20 mg by mouth 2 (two) times daily.     . ARIPiprazole (ABILIFY) 5 MG tablet Take 5 mg by mouth every morning.  5  . famotidine (PEPCID) 20 MG tablet TAKE 1 TABLET BY MOUTH TWICE A DAY 180 tablet 1  . fesoterodine (TOVIAZ) 4 MG TB24 tablet Take 1 tablet (4 mg total) by mouth daily. 90 tablet 3  . HYDROmorphone, PF, (DILAUDID HP) 250 MG injection Inject as directed. PT CURRENTLY WEARS A PAIN PUMP.    . hydrOXYzine (VISTARIL) 50 MG capsule Take 50 mg by mouth daily.   0  . INCRUSE ELLIPTA 62.5 MCG/INH AEPB INHALE 1 PUFF INTO THE LUNGS DAILY 30 each 10  . levothyroxine (SYNTHROID) 50 MCG tablet TAKE 1 TABLET BY MOUTH  EVERY DAY 90 tablet 1  . mirtazapine (REMERON) 30 MG tablet Take 1 tablet by mouth daily.    . naloxone (NARCAN) nasal spray 4 mg/0.1 mL Place 1 spray into the nose once.    . NUCYNTA 75 MG tablet Take 1 tablet by mouth 2 (two) times daily.  0  . venlafaxine XR (EFFEXOR-XR) 150 MG 24 hr capsule 150 mg. Two tablets in the morning    . ADVAIR DISKUS 100-50 MCG/DOSE AEPB Inhale 1 puff into the lungs in the morning and at bedtime. 60 each 11  . diclofenac Sodium (VOLTAREN) 1 % GEL Apply 2 g topically 4 (four) times daily. 100 g 3  . ondansetron (ZOFRAN ODT) 8 MG disintegrating tablet Take 1 tablet (8 mg total) by mouth every 8 (eight) hours as needed for nausea or vomiting. 15 tablet 0  . Spacer/Aero-Holding Chambers (AEROCHAMBER MV) inhaler Use with MDI as needed 1 each 2   No facility-administered medications prior to visit.    Allergies  Allergen Reactions  . Codeine Phosphate Nausea And Vomiting    ROS Review of Systems  Constitutional: Negative for appetite change, chills and fever.  Respiratory: Positive for shortness of breath. Negative for cough and wheezing.   Cardiovascular: Negative for chest pain.  Gastrointestinal: Negative for abdominal pain.  Neurological: Negative for dizziness and headaches.      Objective:    Physical Exam Vitals reviewed.  Constitutional:      Appearance: She is well-developed.  Cardiovascular:     Comments: She has irregularity which is frequently every fourth beat which suggest possible quadrigeminy. Pulmonary:     Comments: She has some diffuse wheezes.  No rales.  Pulse oximetry 94% room air.  No increased work of breathing Musculoskeletal:     Right lower leg: No edema.     Left lower leg: No edema.  Neurological:     Mental Status: She is alert.     BP 136/72 (BP Location: Left Arm, Patient Position: Sitting, Cuff Size: Normal)   Pulse (!) 112   Temp 99.8 F (37.7 C) (Oral)   Wt 228 lb 14.4 oz (103.8 kg)   SpO2 94%   BMI 35.85  kg/m  Wt Readings from Last 3 Encounters:  01/27/20 228 lb 14.4 oz (103.8 kg)  06/15/19 207 lb (93.9 kg)  04/08/19 212 lb 9.6 oz (96.4 kg)     Health Maintenance Due  Topic Date Due  . COVID-19 Vaccine (1) Never done  . PAP SMEAR-Modifier  07/27/2014  . TETANUS/TDAP  12/27/2017  . DEXA  SCAN  Never done    There are no preventive care reminders to display for this patient.  Lab Results  Component Value Date   TSH 3.42 04/08/2019   Lab Results  Component Value Date   WBC 6.4 11/25/2017   HGB 14.7 11/25/2017   HCT 43.4 11/25/2017   MCV 81.2 11/25/2017   PLT 324.0 11/25/2017   Lab Results  Component Value Date   NA 138 04/07/2018   K 4.0 04/07/2018   CO2 26 04/07/2018   GLUCOSE 109 (H) 04/07/2018   BUN 8 04/07/2018   CREATININE 0.76 04/07/2018   BILITOT 0.3 04/07/2018   ALKPHOS 106 04/07/2018   AST 13 04/07/2018   ALT 14 04/07/2018   PROT 6.6 04/07/2018   ALBUMIN 3.7 04/07/2018   CALCIUM 9.0 04/07/2018   ANIONGAP 8 06/17/2017   GFR 81.40 04/07/2018   No results found for: CHOL No results found for: HDL No results found for: LDLCALC No results found for: TRIG No results found for: CHOLHDL No results found for: ZJIR6V    Assessment & Plan:   Problem List Items Addressed This Visit    None    Visit Diagnoses    Acute exacerbation of chronic obstructive pulmonary disease (COPD) (HCC)    -  Primary   Relevant Medications   Fluticasone-Salmeterol (ADVAIR DISKUS) 250-50 MCG/DOSE AEPB   methylPREDNISolone acetate (DEPO-MEDROL) injection 80 mg (Completed)    We recommended Depo-Medrol 80 mg IM.  She has been sensitive to oral steroids in the past  Strongly encouraged to stop smoking altogether  Increase Advair from 100/50 to 200/50  Continue Incruse inhaler daily and also rescue inhaler as needed  Touch base by next week if she is not seeing improvement in her wheezing  Recommend routine follow-up in 3 months and reassess labs at that point including  thyroid functions  Meds ordered this encounter  Medications  . Fluticasone-Salmeterol (ADVAIR DISKUS) 250-50 MCG/DOSE AEPB    Sig: Inhale 1 puff into the lungs 2 (two) times daily.    Dispense:  1 each    Refill:  11  . methylPREDNISolone acetate (DEPO-MEDROL) injection 80 mg    Follow-up: Return in about 3 months (around 04/28/2020).    Evelena Peat, MD

## 2020-02-18 ENCOUNTER — Encounter: Payer: Self-pay | Admitting: Family Medicine

## 2020-02-24 ENCOUNTER — Ambulatory Visit: Payer: Medicare Other | Admitting: Family Medicine

## 2020-03-02 ENCOUNTER — Encounter: Payer: Self-pay | Admitting: Family Medicine

## 2020-03-05 NOTE — Telephone Encounter (Signed)
Please advise 

## 2020-04-20 ENCOUNTER — Encounter: Payer: Self-pay | Admitting: Family Medicine

## 2020-05-18 ENCOUNTER — Encounter: Payer: Self-pay | Admitting: Family Medicine

## 2020-05-18 MED ORDER — ALBUTEROL SULFATE HFA 108 (90 BASE) MCG/ACT IN AERS
INHALATION_SPRAY | RESPIRATORY_TRACT | 4 refills | Status: DC
Start: 2020-05-18 — End: 2020-09-10

## 2020-05-31 ENCOUNTER — Telehealth: Payer: Self-pay | Admitting: Family Medicine

## 2020-05-31 NOTE — Telephone Encounter (Signed)
Left message for patient to schedule Annual Wellness Visit.  Please schedule with Nurse Health Advisor Shannon Crews, RN at [facility]  

## 2020-06-12 ENCOUNTER — Encounter: Payer: Self-pay | Admitting: Family Medicine

## 2020-06-29 ENCOUNTER — Ambulatory Visit: Payer: Medicare Other | Admitting: Family Medicine

## 2020-07-02 ENCOUNTER — Ambulatory Visit: Payer: Medicare Other | Admitting: Family Medicine

## 2020-07-16 ENCOUNTER — Other Ambulatory Visit: Payer: Self-pay | Admitting: Family Medicine

## 2020-07-17 ENCOUNTER — Ambulatory Visit: Payer: Medicare Other | Admitting: Family Medicine

## 2020-07-22 ENCOUNTER — Other Ambulatory Visit: Payer: Self-pay | Admitting: Family Medicine

## 2020-07-24 ENCOUNTER — Other Ambulatory Visit: Payer: Self-pay | Admitting: Family Medicine

## 2020-08-12 ENCOUNTER — Other Ambulatory Visit: Payer: Self-pay | Admitting: Family Medicine

## 2020-08-24 ENCOUNTER — Telehealth: Payer: Self-pay | Admitting: Family Medicine

## 2020-08-24 NOTE — Telephone Encounter (Signed)
Tried calling patient to  schedule Medicare Annual Wellness Visit (AWV) either virtually or in office.  No answre    Last AWVI no information  please schedule at anytime with [facility] Nurse Health Advisor 1 or 2   This should be a 45 minute visit.

## 2020-09-01 ENCOUNTER — Other Ambulatory Visit: Payer: Self-pay | Admitting: Family Medicine

## 2020-09-10 ENCOUNTER — Other Ambulatory Visit: Payer: Self-pay | Admitting: Family Medicine

## 2020-09-11 ENCOUNTER — Other Ambulatory Visit: Payer: Self-pay | Admitting: Family Medicine

## 2020-10-09 ENCOUNTER — Other Ambulatory Visit: Payer: Self-pay | Admitting: Family Medicine

## 2020-10-29 ENCOUNTER — Telehealth: Payer: Self-pay | Admitting: Family Medicine

## 2020-10-29 NOTE — Telephone Encounter (Signed)
Tried calling patient to  schedule Medicare Annual Wellness Visit (AWV) either virtually or in office.  No answer    awv-i per [facility] 07/14/09  please schedule at anytime with [facility] Nurse Health Advisor 1 or 2   This should be a 45 minute visit.

## 2020-11-10 ENCOUNTER — Other Ambulatory Visit: Payer: Self-pay | Admitting: Family Medicine

## 2021-01-02 ENCOUNTER — Other Ambulatory Visit: Payer: Self-pay

## 2021-01-09 ENCOUNTER — Ambulatory Visit: Payer: Medicare Other

## 2021-01-23 ENCOUNTER — Telehealth: Payer: Self-pay | Admitting: Family Medicine

## 2021-01-23 ENCOUNTER — Other Ambulatory Visit: Payer: Self-pay | Admitting: Family Medicine

## 2021-01-23 NOTE — Telephone Encounter (Signed)
Left message for patient to call back and schedule Medicare Annual Wellness Visit (AWV) either virtually or in office.   awvi per [facility] 07/14/09  please schedule at anytime with [facility] Nurse Health Advisor 1 or 2   This should be a 45 minute visit.  

## 2021-01-31 ENCOUNTER — Other Ambulatory Visit: Payer: Self-pay | Admitting: Family Medicine

## 2021-02-01 NOTE — Telephone Encounter (Signed)
Please advise. I do not see this inhaler on the med list.

## 2021-03-19 ENCOUNTER — Telehealth: Payer: Self-pay | Admitting: Family Medicine

## 2021-03-19 NOTE — Telephone Encounter (Signed)
Last office visit- 01/27/20.   Please advise

## 2021-03-19 NOTE — Telephone Encounter (Signed)
PT called to see if it was safe for her to get the Covid Booster shot with her health history and smoking habit. 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

## 2021-03-20 NOTE — Telephone Encounter (Signed)
Called patient informed of message 

## 2021-03-26 ENCOUNTER — Other Ambulatory Visit: Payer: Self-pay | Admitting: Family Medicine

## 2021-03-29 ENCOUNTER — Telehealth (INDEPENDENT_AMBULATORY_CARE_PROVIDER_SITE_OTHER): Payer: Medicare Other | Admitting: Family Medicine

## 2021-03-29 ENCOUNTER — Other Ambulatory Visit: Payer: Self-pay

## 2021-03-29 DIAGNOSIS — R059 Cough, unspecified: Secondary | ICD-10-CM

## 2021-03-29 DIAGNOSIS — J449 Chronic obstructive pulmonary disease, unspecified: Secondary | ICD-10-CM

## 2021-03-29 DIAGNOSIS — R509 Fever, unspecified: Secondary | ICD-10-CM | POA: Diagnosis not present

## 2021-03-29 MED ORDER — AMOXICILLIN-POT CLAVULANATE 875-125 MG PO TABS: 1.0000 | ORAL_TABLET | Freq: Two times a day (BID) | ORAL | 0 refills | Status: DC

## 2021-03-29 NOTE — Progress Notes (Signed)
Patient ID: Cynthia [name], female   DOB: 1956-01-01, 65 y.o.   MRN: [id number]  This visit type was conducted due to national recommendations for restrictions regarding the COVID-19 pandemic in an effort to limit this patient's exposure and mitigate transmission in our community.   Virtual Visit via Telephone Note  I connected with Cynthia [name] on 03/29/21 at  2:00 PM EDT by telephone and verified that I am speaking with the correct person using two identifiers.   I discussed the limitations, risks, security and privacy concerns of performing an evaluation and management service by telephone and the availability of in person appointments. I also discussed with the patient that there may be a patient responsible charge related to this service. The patient expressed understanding and agreed to proceed.  Location patient: home Location provider: work or home office Participants present for the call: patient, provider Patient did not have a visit in the prior 7 days to address this/these issue(s).   History of Present Illness:  Cynthia [name] relates 2 weeks of feeling "poorly".  She had some cough and fatigue.  She had some mild nausea but no vomiting.  No abdominal pain.  No dysuria.  Some decreased appetite.  Main symptom has been cough.  No hemoptysis.  She does have longstanding history of ongoing nicotine use.  Her chronic medical problems include history of COPD, hypothyroidism, osteoarthritis, bipolar disorder, obstructive sleep apnea  Past Medical History:  Diagnosis Date   Bipolar disorder (HCC)    COPD (chronic obstructive pulmonary disease) (HCC)    Depression    GERD (gastroesophageal reflux disease)    Past Surgical History:  Procedure Laterality Date   APPENDECTOMY     CHOLECYSTECTOMY     LUMBAR LAMINECTOMY     6 surgeries    reports that she has been smoking cigarettes. She has never used smokeless tobacco. She reports that she does not drink alcohol and does not  use drugs. family history includes Alzheimer's disease in her mother; Colon cancer in her father. Allergies  Allergen Reactions   Codeine Phosphate Nausea And Vomiting    Observations/Objective: Patient sounds cheerful and well on the phone. I do not appreciate any SOB. Speech and thought processing are grossly intact. Patient reported vitals:  Assessment and Plan:  Patient relates 2-week history of some intermittent low-grade fever and cough along with malaise.  She has known COPD history.  -We recommend starting Augmentin 875 mg twice daily for 10 days.  Continue her usual respiratory inhalers. -Stay well-hydrated. -We recommended in office follow-up the next week if not improving.  She knows to go to the ER if she has any increased shortness of breath, increasing fever, or other concerns in the meantime  Follow Up Instructions:    99441 5-10 [id number] 11-20 [id number] 21-30 I did not refer this patient for an OV in the next 24 hours for this/these issue(s).  I discussed the assessment and treatment plan with the patient. The patient was provided an opportunity to ask questions and all were answered. The patient agreed with the plan and demonstrated an understanding of the instructions.   The patient was advised to call back or seek an in-person evaluation if the symptoms worsen or if the condition fails to improve as anticipated.  I provided 23 minutes of non-face-to-face time during this encounter.   Evelena Peat, MD

## 2021-04-15 ENCOUNTER — Ambulatory Visit: Payer: Medicare Other | Admitting: Family Medicine

## 2021-04-21 ENCOUNTER — Encounter: Payer: Self-pay | Admitting: Family Medicine

## 2021-05-10 ENCOUNTER — Other Ambulatory Visit: Payer: Self-pay

## 2021-05-10 ENCOUNTER — Ambulatory Visit (INDEPENDENT_AMBULATORY_CARE_PROVIDER_SITE_OTHER): Payer: Medicare Other | Admitting: Family Medicine

## 2021-05-10 VITALS — BP 160/80 | HR 106 | Temp 98.2°F | Wt 222.2 lb

## 2021-05-10 DIAGNOSIS — R030 Elevated blood-pressure reading, without diagnosis of hypertension: Secondary | ICD-10-CM | POA: Diagnosis not present

## 2021-05-10 DIAGNOSIS — J449 Chronic obstructive pulmonary disease, unspecified: Secondary | ICD-10-CM

## 2021-05-10 DIAGNOSIS — Z23 Encounter for immunization: Secondary | ICD-10-CM | POA: Diagnosis not present

## 2021-05-10 DIAGNOSIS — E039 Hypothyroidism, unspecified: Secondary | ICD-10-CM

## 2021-05-10 MED ORDER — LOSARTAN POTASSIUM 50 MG PO TABS
50.0000 mg | ORAL_TABLET | Freq: Every day | ORAL | 5 refills | Status: DC
Start: 2021-05-10 — End: 2021-08-19

## 2021-05-10 NOTE — Progress Notes (Signed)
Established Patient Office Visit  Subjective:  Patient ID: Cynthia [name], female    DOB: December 19, 1955  Age: 65 y.o. MRN: [id number]  CC:  Chief Complaint  Patient presents with   COPD    Stopped inhalers other that rescue inhaler and breathing has improved.    HPI Cynthia [name] presents for several items as follows  Longstanding history of COPD.  Still smokes 1 pack cigarettes per day.  Low motivation to quit.  She was having increased wheezing and shortness of breath and on her own stopped Incruse and Wixela inhalers couple weeks ago.  She noted almost immediately that her shortness of breath seemed to improve.  At this point she is using rescue inhaler only.  We did mention possible paradoxical bronchospasm with both these inhalers.  She had recent productive cough which is treated with Augmentin.  Still needs flu vaccine.  Blood pressure up today and she has had several elevated readings in other places recently.  Never treated for hypertension.  No headaches.  She tries to watch her sodium intake.  No alcohol use.  Hypothyroidism.  She is overdue for labs.  She unfortunately ran out of her thyroid medicine recently and has only been on this for a few days after being out for over a month.  Past Medical History:  Diagnosis Date   Bipolar disorder (HCC)    COPD (chronic obstructive pulmonary disease) (HCC)    Depression    GERD (gastroesophageal reflux disease)     Past Surgical History:  Procedure Laterality Date   APPENDECTOMY     CHOLECYSTECTOMY     LUMBAR LAMINECTOMY     6 surgeries    Family History  Problem Relation Age of Onset   Alzheimer's disease Mother    Colon cancer Father     Social History   Socioeconomic History   Marital status: Divorced    Spouse name: Not on file   Number of children: Not on file   Years of education: Not on file   Highest education level: Not on file  Occupational History   Not on file  Tobacco Use   Smoking  status: Every Day    Types: Cigarettes   Smokeless tobacco: Never  Vaping Use   Vaping Use: Never used  Substance and Sexual Activity   Alcohol use: No   Drug use: No   Sexual activity: Never  Other Topics Concern   Not on file  Social History Narrative   Not on file   Social Determinants of Health   Financial Resource Strain: Not on file  Food Insecurity: Not on file  Transportation Needs: Not on file  Physical Activity: Not on file  Stress: Not on file  Social Connections: Not on file  Intimate Partner Violence: Not on file    Outpatient Medications Prior to Visit  Medication Sig Dispense Refill   albuterol (VENTOLIN HFA) 108 (90 Base) MCG/ACT inhaler INHALE 2 PUFFS BY MOUTH EVERY 4 HOURS AS NEEDED FOR WHEEZE OR FOR SHORTNESS OF BREATH 8.5 each 4   ARIPiprazole (ABILIFY) 5 MG tablet Take 5 mg by mouth every morning.  5   famotidine (PEPCID) 20 MG tablet TAKE 1 TABLET BY MOUTH TWICE A DAY 180 tablet 1   fluticasone-salmeterol (WIXELA INHUB) 250-50 MCG/ACT AEPB TAKE 1 PUFF BY MOUTH TWICE A DAY 60 each 2   HYDROmorphone, PF, (DILAUDID HP) 250 MG injection Inject as directed. PT CURRENTLY WEARS A PAIN PUMP.     hydrOXYzine (  VISTARIL) 50 MG capsule Take 50 mg by mouth daily.   0   INCRUSE ELLIPTA 62.5 MCG/INH AEPB INHALE 1 PUFF BY MOUTH EVERY DAY 30 each 0   levothyroxine (SYNTHROID) 50 MCG tablet TAKE 1 TABLET BY MOUTH EVERY DAY 90 tablet 1   mirtazapine (REMERON) 30 MG tablet Take 1 tablet by mouth daily.     naloxone (NARCAN) nasal spray 4 mg/0.1 mL Place 1 spray into the nose once.     NUCYNTA 75 MG tablet Take 1 tablet by mouth 2 (two) times daily.  0   venlafaxine XR (EFFEXOR-XR) 150 MG 24 hr capsule 150 mg. Two tablets in the morning     amoxicillin-clavulanate (AUGMENTIN) 875-125 MG tablet Take 1 tablet by mouth 2 (two) times daily. 20 tablet 0   amphetamine-dextroamphetamine (ADDERALL) 20 MG tablet Take 20 mg by mouth 2 (two) times daily.      fesoterodine (TOVIAZ) 4 MG  TB24 tablet Take 1 tablet (4 mg total) by mouth daily. 90 tablet 3   No facility-administered medications prior to visit.    Allergies  Allergen Reactions   Codeine Phosphate Nausea And Vomiting    ROS Review of Systems  Constitutional:  Negative for chills, fatigue and unexpected weight change.  Eyes:  Negative for visual disturbance.  Respiratory:  Negative for cough and chest tightness.   Cardiovascular:  Negative for chest pain, palpitations and leg swelling.  Genitourinary:  Negative for dysuria.  Neurological:  Negative for dizziness, seizures, syncope, weakness, light-headedness and headaches.     Objective:    Physical Exam Vitals reviewed.  Cardiovascular:     Rate and Rhythm: Normal rate.  Pulmonary:     Comments: She has some diffuse wheezes.  No rales.  No respiratory distress. Musculoskeletal:     Right lower leg: No edema.     Left lower leg: No edema.  Neurological:     Mental Status: She is alert.    BP (!) 160/80 (BP Location: Left Arm, Patient Position: Sitting, Cuff Size: Normal)   Pulse (!) 106   Temp 98.2 F (36.8 C) (Oral)   Wt 222 lb 3.2 oz (100.8 kg)   SpO2 94%   BMI 34.80 kg/m  Wt Readings from Last 3 Encounters:  05/10/21 222 lb 3.2 oz (100.8 kg)  01/27/20 228 lb 14.4 oz (103.8 kg)  06/15/19 207 lb (93.9 kg)     Health Maintenance Due  Topic Date Due   COVID-19 Vaccine (1) Never done   Zoster Vaccines- Shingrix (1 of 2) Never done   TETANUS/TDAP  12/27/2017   DEXA SCAN  Never done   Pneumonia Vaccine 57+ Years old (2 - PCV) 04/07/2020    There are no preventive care reminders to display for this patient.  Lab Results  Component Value Date   TSH 3.42 04/08/2019   Lab Results  Component Value Date   WBC 6.4 11/25/2017   HGB 14.7 11/25/2017   HCT 43.4 11/25/2017   MCV 81.2 11/25/2017   PLT 324.0 11/25/2017   Lab Results  Component Value Date   NA 138 04/07/2018   K 4.0 04/07/2018   CO2 26 04/07/2018   GLUCOSE 109 (H)  04/07/2018   BUN 8 04/07/2018   CREATININE 0.76 04/07/2018   BILITOT 0.3 04/07/2018   ALKPHOS 106 04/07/2018   AST 13 04/07/2018   ALT 14 04/07/2018   PROT 6.6 04/07/2018   ALBUMIN 3.7 04/07/2018   CALCIUM 9.0 04/07/2018   ANIONGAP 8 06/17/2017  GFR 81.40 04/07/2018   No results found for: CHOL No results found for: HDL No results found for: LDLCALC No results found for: TRIG No results found for: CHOLHDL No results found for: IXVE5B    Assessment & Plan:   #1 COPD.  Patient describing worsening shortness of breath with Incruse and Wixela inhalers.  Question of paradoxical bronchospasm. -She stopped both inhalers at one time.  We have suggested that she try to start back once again 1 at a time with the inhaler above and see if she has any recurrent shortness of breath. -Flu vaccine given  #2 elevated blood pressure with multiple recent elevated readings.  Repeat left arm seated after rest 158/90 -Start losartan 50 mg once daily -Bring back in 1 month reassess -Try to keep sodium intake less than 2500 mg daily  #3 hypothyroidism.  She does need follow-up labs but has been out of medication for over a month.  She is now back on her thyroid replacement and will recommend follow-up TSH level at her next office visit in 1 month   Meds ordered this encounter  Medications   losartan (COZAAR) 50 MG tablet    Sig: Take 1 tablet (50 mg total) by mouth daily.    Dispense:  30 tablet    Refill:  5    Follow-up: Return in about 1 month (around 06/10/2021).    Evelena Peat, MD

## 2021-05-21 ENCOUNTER — Other Ambulatory Visit: Payer: Self-pay | Admitting: Family Medicine

## 2021-08-18 ENCOUNTER — Other Ambulatory Visit: Payer: Self-pay | Admitting: Family Medicine

## 2021-09-03 ENCOUNTER — Encounter: Payer: Self-pay | Admitting: Family Medicine

## 2021-09-03 ENCOUNTER — Ambulatory Visit (INDEPENDENT_AMBULATORY_CARE_PROVIDER_SITE_OTHER): Payer: Medicare Other | Admitting: Family Medicine

## 2021-09-03 VITALS — BP 112/80 | HR 97 | Temp 98.2°F | Ht 67.0 in | Wt 229.0 lb

## 2021-09-03 DIAGNOSIS — E039 Hypothyroidism, unspecified: Secondary | ICD-10-CM

## 2021-09-03 DIAGNOSIS — Z111 Encounter for screening for respiratory tuberculosis: Secondary | ICD-10-CM

## 2021-09-03 DIAGNOSIS — L821 Other seborrheic keratosis: Secondary | ICD-10-CM

## 2021-09-03 DIAGNOSIS — E7800 Pure hypercholesterolemia, unspecified: Secondary | ICD-10-CM

## 2021-09-03 DIAGNOSIS — I10 Essential (primary) hypertension: Secondary | ICD-10-CM

## 2021-09-03 DIAGNOSIS — R918 Other nonspecific abnormal finding of lung field: Secondary | ICD-10-CM | POA: Diagnosis not present

## 2021-09-03 LAB — COMPREHENSIVE METABOLIC PANEL
ALT: 19 U/L (ref 0–35)
AST: 17 U/L (ref 0–37)
Albumin: 4.2 g/dL (ref 3.5–5.2)
Alkaline Phosphatase: 78 U/L (ref 39–117)
BUN: 7 mg/dL (ref 6–23)
CO2: 35 mEq/L — ABNORMAL HIGH (ref 19–32)
Calcium: 9.2 mg/dL (ref 8.4–10.5)
Chloride: 98 mEq/L (ref 96–112)
Creatinine, Ser: 0.8 mg/dL (ref 0.40–1.20)
GFR: 76.19 mL/min (ref >60.00)
Glucose, Bld: 98 mg/dL (ref 70–99)
Potassium: 4.1 mEq/L (ref 3.5–5.1)
Sodium: 136 mEq/L (ref 135–145)
Total Bilirubin: 0.3 mg/dL (ref 0.2–1.2)
Total Protein: 7.6 g/dL (ref 6.0–8.3)

## 2021-09-03 LAB — LIPID PANEL
Cholesterol: 223 mg/dL — ABNORMAL HIGH (ref 0–200)
HDL: 59.3 mg/dL (ref >39.00)
LDL Cholesterol: 142 mg/dL — ABNORMAL HIGH (ref 0–99)
NonHDL: 163.69
Total CHOL/HDL Ratio: 4
Triglycerides: 110 mg/dL (ref 0.0–149.0)
VLDL: 22 mg/dL (ref 0.0–40.0)

## 2021-09-03 LAB — TSH: TSH: 3.67 u[IU]/mL (ref 0.35–5.50)

## 2021-09-03 NOTE — Patient Instructions (Signed)
[facility] Radiology-   [phone]  SET CT CHEST-CALL TO RESCHEDULE.      Face lesion is benign seborrheic keratosis.

## 2021-09-03 NOTE — Progress Notes (Signed)
Established Patient Office Visit  Subjective:  Patient ID: Cynthia [name], female    DOB: [birth year]  Age: 66 y.o. MRN: [id number]  CC:  Chief Complaint  Patient presents with   Rash    Patient complains of "brown spot" on the right side of her face, below the eye x2 months   Results    Patient states her pain management provider, Dr Veverly Fells informed her there a concern of a lung abnormality per myelogram results   Patient requests TB skin test per employer    HPI Cynthia [name] presents for multiple items as follows  Patient has a longstanding history of smoking.  Currently smokes 2 packs of cigarettes per day.  COPD.  Chronic dyspnea with exertion.  Followed by pain management specialists in [place].  She had recent myelogram which showed left upper lobe ovoid mass as follows  Ovoid nodular lesion in the posterior left upper lobe/left suprahilar region, measuring approximately 15 mm (series 7, image 67). This is new since 10/14/2018, although there was a small cavitary area at this site on the prior CT and this may have filled in with fluid. Follow-up chest CT with IV contrast is recommended for further evaluation.   She apparently had CT chest with contrast scheduled by that provider but she had to cancel her appointment.  She has not rescheduled yet.  No recent hemoptysis.  No appetite or weight changes.  Patient needs TB screening because of her work.  No known previous high risk exposures.  No chronic cough or any fever or night sweats.  Brown skin lesion slightly scaly right side of face just below the eye.  Just noticed this recently.  She has history of hypothyroidism and hypertension.  She has not had thyroid functions in over a couple years.  Also no recent lipids in years.  She is followed by psychiatrist and most of her medications are written by psychiatry.  She does states she has been compliant with taking her thyroid medication.  Past Medical History:   Diagnosis Date   Bipolar disorder ([place])    COPD (chronic obstructive pulmonary disease) (HCC)    Depression    GERD (gastroesophageal reflux disease)     Past Surgical History:  Procedure Laterality Date   APPENDECTOMY     CHOLECYSTECTOMY     LUMBAR LAMINECTOMY     6 surgeries    Family History  Problem Relation Age of Onset   Alzheimer's disease Mother    Colon cancer Father     Social History   Socioeconomic History   Marital status: Divorced    Spouse name: Not on file   Number of children: Not on file   Years of education: Not on file   Highest education level: Bachelor's degree (e.g., BA, AB, BS)  Occupational History   Not on file  Tobacco Use   Smoking status: Every Day    Types: Cigarettes   Smokeless tobacco: Never  Vaping Use   Vaping Use: Never used  Substance and Sexual Activity   Alcohol use: No   Drug use: No   Sexual activity: Never  Other Topics Concern   Not on file  Social History Narrative   Not on file   Social Determinants of Health   Financial Resource Strain: Medium Risk   Difficulty of Paying Living Expenses: Somewhat hard  Food Insecurity: Food Insecurity Present   Worried About Running Out of Food in the Last Year: Often true   Ran  Out of Food in the Last Year: Often true  Transportation Needs: Unmet Transportation Needs   Lack of Transportation (Medical): Yes   Lack of Transportation (Non-Medical): Yes  Physical Activity: Unknown   Days of Exercise per Week: 0 days   Minutes of Exercise per Session: Not on file  Stress: Stress Concern Present   Feeling of Stress : To some extent  Social Connections: Moderately Isolated   Frequency of Communication with Friends and Family: More than three times a week   Frequency of Social Gatherings with Friends and Family: More than three times a week   Attends Religious Services: More than 4 times per year   Active Member of [employer] or Organizations: No   Attends Arts administrator: Not on file   Marital Status: Divorced  Human resources officer Violence: Not on file    Outpatient Medications Prior to Visit  Medication Sig Dispense Refill   albuterol (VENTOLIN HFA) 108 (90 Base) MCG/ACT inhaler INHALE 2 PUFFS BY MOUTH EVERY 4 HOURS AS NEEDED FOR WHEEZE OR FOR SHORTNESS OF BREATH 8.5 each 4   ARIPiprazole (ABILIFY) 5 MG tablet Take 5 mg by mouth every morning.  5   famotidine (PEPCID) 20 MG tablet TAKE 1 TABLET BY MOUTH TWICE A DAY 180 tablet 1   HYDROmorphone, PF, (DILAUDID HP) 250 MG injection Inject as directed. PT CURRENTLY WEARS A PAIN PUMP.     hydrOXYzine (VISTARIL) 50 MG capsule Take 50 mg by mouth. Take 2 tablets in the morning, one in the evening  0   levothyroxine (SYNTHROID) 50 MCG tablet TAKE 1 TABLET BY MOUTH EVERY DAY 90 tablet 1   losartan (COZAAR) 50 MG tablet TAKE 1 TABLET BY MOUTH EVERY DAY 90 tablet 1   mirtazapine (REMERON) 30 MG tablet Take 1 tablet by mouth daily.     naloxone (NARCAN) nasal spray 4 mg/0.1 mL Place 1 spray into the nose once.     NUCYNTA 75 MG tablet Take 1 tablet by mouth 2 (two) times daily.  0   venlafaxine XR (EFFEXOR-XR) 150 MG 24 hr capsule 150 mg. Two tablets in the morning     fluticasone-salmeterol (WIXELA INHUB) 250-50 MCG/ACT AEPB TAKE 1 PUFF BY MOUTH TWICE A DAY 60 each 2   INCRUSE ELLIPTA 62.5 MCG/INH AEPB INHALE 1 PUFF BY MOUTH EVERY DAY 30 each 0   No facility-administered medications prior to visit.    Allergies  Allergen Reactions   Codeine Phosphate Nausea And Vomiting    ROS Review of Systems  Constitutional:  Negative for fatigue and unexpected weight change.  Eyes:  Negative for visual disturbance.  Respiratory:  Negative for cough, chest tightness, shortness of breath and wheezing.   Cardiovascular:  Negative for chest pain, palpitations and leg swelling.  Endocrine: Negative for polydipsia and polyuria.  Neurological:  Negative for dizziness, seizures, syncope, weakness, light-headedness and  headaches.     Objective:    Physical Exam Constitutional:      Appearance: She is well-developed.  Eyes:     Pupils: Pupils are equal, round, and reactive to light.  Neck:     Thyroid: No thyromegaly.     Vascular: No JVD.  Cardiovascular:     Rate and Rhythm: Normal rate and regular rhythm.     Heart sounds:    No gallop.  Pulmonary:     Effort: No respiratory distress.     Comments: Somewhat diminished breath sounds throughout. Musculoskeletal:     Cervical back: Neck  supple.  Skin:    Comments: Small round well-demarcated brownish scaly lesion right side of face below the eye.  This is approximately 2 x 2 mm  Neurological:     Mental Status: She is alert.    BP 112/80 (BP Location: Left Arm, Patient Position: Sitting, Cuff Size: Large)    Pulse 97    Temp 98.2 F (36.8 C) (Oral)    Ht 5\' 7"  (1.702 m)    Wt 229 lb (103.9 kg)    SpO2 90%    BMI 35.87 kg/m  Wt Readings from Last 3 Encounters:  09/03/21 229 lb (103.9 kg)  05/10/21 222 lb 3.2 oz (100.8 kg)  01/27/20 228 lb 14.4 oz (103.8 kg)     Health Maintenance Due  Topic Date Due   DEXA SCAN  Never done   Pneumonia Vaccine 62+ Years old (2 - PCV) 04/07/2020    There are no preventive care reminders to display for this patient.  Lab Results  Component Value Date   TSH 3.42 04/08/2019   Lab Results  Component Value Date   WBC 6.4 11/25/2017   HGB 14.7 11/25/2017   HCT 43.4 11/25/2017   MCV 81.2 11/25/2017   PLT 324.0 11/25/2017   Lab Results  Component Value Date   NA 138 04/07/2018   K 4.0 04/07/2018   CO2 26 04/07/2018   GLUCOSE 109 (H) 04/07/2018   BUN 8 04/07/2018   CREATININE 0.76 04/07/2018   BILITOT 0.3 04/07/2018   ALKPHOS 106 04/07/2018   AST 13 04/07/2018   ALT 14 04/07/2018   PROT 6.6 04/07/2018   ALBUMIN 3.7 04/07/2018   CALCIUM 9.0 04/07/2018   ANIONGAP 8 06/17/2017   GFR 81.40 04/07/2018   No results found for: CHOL No results found for: HDL No results found for: LDLCALC No  results found for: TRIG No results found for: CHOLHDL No results found for: HGBA1C    Assessment & Plan:   #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  Patient was reportedly scheduled for CT but never went.  She is strongly encouraged to reschedule this.  #2 ongoing nicotine use.  We discussed smoking cessation.  She does have some interest.  Handout given.  Discussed nicotine patches.  Would be reluctant to use Chantix without discussing with her psychiatrist.  #3 hypothyroidism.  Overdue for check.  Recheck TSH.  #4 hypertension stable.  Continue current medication Do recommend checking lipids and this will be checked today.  Very high risk for cardiovascular disease  #5 patient requesting TB screening.  Because of her difficulties with travel and living several miles away we discussed getting QuantiFERON gold assay versus skin test with [facility]   No orders of the defined types were placed in this encounter.   Follow-up: No follow-ups on file.    Carolann Littler, MD

## 2021-09-04 ENCOUNTER — Telehealth: Payer: Self-pay | Admitting: Family Medicine

## 2021-09-04 MED ORDER — ROSUVASTATIN CALCIUM 10 MG PO TABS: 10.0000 mg | ORAL_TABLET | Freq: Every day | ORAL | 0 refills | Status: DC

## 2021-09-04 NOTE — Addendum Note (Signed)
Addended by: Christy Sartorius on: 09/04/2021 02:36 PM   Modules accepted: Orders

## 2021-09-04 NOTE — Addendum Note (Signed)
Addended by: Nilda Riggs on: 09/04/2021 02:31 PM   Modules accepted: Orders

## 2021-09-04 NOTE — Telephone Encounter (Signed)
Pt is calling and would like blood work results 

## 2021-09-04 NOTE — Telephone Encounter (Signed)
Pt informed of results and verbalized understanding.  

## 2021-09-05 ENCOUNTER — Encounter: Payer: Self-pay | Admitting: Family Medicine

## 2021-09-06 LAB — QUANTIFERON-TB GOLD PLUS
Mitogen-NIL: 4.38 IU/mL
NIL: 0.04 IU/mL
QuantiFERON-TB Gold Plus: NEGATIVE
TB1-NIL: 0 IU/mL
TB2-NIL: 0.01 IU/mL

## 2021-09-09 ENCOUNTER — Encounter: Payer: Self-pay | Admitting: Family Medicine

## 2021-10-08 ENCOUNTER — Other Ambulatory Visit: Payer: Self-pay | Admitting: Family Medicine

## 2021-10-09 ENCOUNTER — Telehealth (INDEPENDENT_AMBULATORY_CARE_PROVIDER_SITE_OTHER): Payer: Medicare Other | Admitting: Family Medicine

## 2021-10-09 ENCOUNTER — Encounter: Payer: Self-pay | Admitting: Family Medicine

## 2021-10-09 VITALS — Ht 67.0 in | Wt 229.0 lb

## 2021-10-09 DIAGNOSIS — R5383 Other fatigue: Secondary | ICD-10-CM

## 2021-10-09 NOTE — Progress Notes (Signed)
Patient ID: Cynthia [name], female   DOB: 1955-10-08, 66 y.o.   MRN: [id number] ? ? ?This visit type was conducted due to national recommendations for restrictions regarding the COVID-19 pandemic in an effort to limit this patient's exposure and mitigate transmission in our community.  ? ?Virtual Visit via Video Note ? ?I connected with Cynthia [name] on 10/09/21 at 11:30 AM EDT by a video enabled telemedicine application and verified that I am speaking with the correct person using two identifiers. ? Location patient: home ?Location provider:work or home office ?Persons participating in the virtual visit: patient, provider ? ?I discussed the limitations of evaluation and management by telemedicine and the availability of in person appointments. The patient expressed understanding and agreed to proceed. ? ? ?HPI: ? ?Cynthia [name] called with increased fatigue especially past month or so.  Her chronic problems include COPD, obstructive sleep apnea, GERD, hypothyroidism, osteoarthritis, bipolar disorder.  She has chronic dyspnea and perhaps more dyspneic over the past month or so with minimal activity.  No chest pain.  Ongoing nicotine use.  She has some chronic cough which is unchanged.  No hemoptysis.  No appetite or weight changes.  She wonders if her fatigue may be related to her progressive lung disease.  She states she is sleeping without difficulty.  Did have recent TSH back in February which was stable. ? ? ?ROS: See pertinent positives and negatives per HPI. ? ?Past Medical History:  ?Diagnosis Date  ? Bipolar disorder (HCC)   ? COPD (chronic obstructive pulmonary disease) (HCC)   ? Depression   ? GERD (gastroesophageal reflux disease)   ? ? ?Past Surgical History:  ?Procedure Laterality Date  ? APPENDECTOMY    ? CHOLECYSTECTOMY    ? LUMBAR LAMINECTOMY    ? 6 surgeries  ? ? ?Family History  ?Problem Relation Age of Onset  ? Alzheimer's disease Mother   ? Colon cancer Father   ? ? ?SOCIAL HX: Ongoing  nicotine use ? ? ?Current Outpatient Medications:  ?  ARIPiprazole (ABILIFY) 5 MG tablet, Take 5 mg by mouth every morning., Disp: , Rfl: 5 ?  famotidine (PEPCID) 20 MG tablet, TAKE 1 TABLET BY MOUTH TWICE A DAY, Disp: 180 tablet, Rfl: 1 ?  HYDROmorphone, PF, (DILAUDID HP) 250 MG injection, Inject as directed. PT CURRENTLY WEARS A PAIN PUMP., Disp: , Rfl:  ?  hydrOXYzine (VISTARIL) 50 MG capsule, Take 50 mg by mouth. Take 2 tablets in the morning, one in the evening, Disp: , Rfl: 0 ?  levothyroxine (SYNTHROID) 50 MCG tablet, TAKE 1 TABLET BY MOUTH EVERY DAY, Disp: 90 tablet, Rfl: 1 ?  losartan (COZAAR) 50 MG tablet, TAKE 1 TABLET BY MOUTH EVERY DAY, Disp: 90 tablet, Rfl: 1 ?  mirtazapine (REMERON) 30 MG tablet, Take 1 tablet by mouth daily., Disp: , Rfl:  ?  naloxone (NARCAN) nasal spray 4 mg/0.1 mL, Place 1 spray into the nose once., Disp: , Rfl:  ?  NUCYNTA 75 MG tablet, Take 1 tablet by mouth 2 (two) times daily., Disp: , Rfl: 0 ?  rosuvastatin (CRESTOR) 10 MG tablet, Take 1 tablet (10 mg total) by mouth daily., Disp: 90 tablet, Rfl: 0 ?  venlafaxine XR (EFFEXOR-XR) 150 MG 24 hr capsule, 150 mg. Two tablets in the morning, Disp: , Rfl:  ?  albuterol (VENTOLIN HFA) 108 (90 Base) MCG/ACT inhaler, INHALE 2 PUFFS BY MOUTH EVERY 4 HOURS AS NEEDED FOR WHEEZE OR FOR SHORTNESS OF BREATH, Disp: 8.5 each, Rfl: 4 ? ?  EXAM: ? ?VITALS per patient if applicable: ? ?GENERAL: alert, oriented, appears well and in no acute distress ? ?HEENT: atraumatic, conjunttiva clear, no obvious abnormalities on inspection of external nose and ears ? ?NECK: normal movements of the head and neck ? ?LUNGS: on inspection no signs of respiratory distress, breathing rate appears normal, no obvious gross SOB, gasping or wheezing ? ?CV: no obvious cyanosis ? ?MS: moves all visible extremities without noticeable abnormality ? ?PSYCH/NEURO: pleasant and cooperative, no obvious depression or anxiety, speech and thought processing grossly  intact ? ?ASSESSMENT AND PLAN: ? ?Discussed the following assessment and plan: ? ?Fatigue, unspecified type ? ?We discussed that fatigue differential is very broad.  We recommended setting up office follow-up to further assess.  We need to reassess her oxygen both at rest and with ambulation to see if this is dropping significantly.  Consider chest x-ray and EKG. ? ? ?  ?I discussed the assessment and treatment plan with the patient. The patient was provided an opportunity to ask questions and all were answered. The patient agreed with the plan and demonstrated an understanding of the instructions. ?  ?The patient was advised to call back or seek an in-person evaluation if the symptoms worsen or if the condition fails to improve as anticipated. ? ? ? ? ?Evelena Peat, MD  ? ?

## 2021-10-11 ENCOUNTER — Telehealth: Payer: Self-pay | Admitting: Family Medicine

## 2021-10-11 NOTE — Telephone Encounter (Signed)
Pt is calling and would like a stress test. Please advise ?

## 2021-10-11 NOTE — Telephone Encounter (Signed)
I spoke with the pt and she stated that her and PCP discussed her having a stress test during last visit on 10/09/2021 ?

## 2021-10-14 ENCOUNTER — Ambulatory Visit: Payer: Medicare Other | Admitting: Family Medicine

## 2021-10-14 NOTE — Telephone Encounter (Signed)
Pt has been scheduled for f/u.

## 2021-10-16 ENCOUNTER — Ambulatory Visit: Payer: Medicare Other | Admitting: Family Medicine

## 2021-10-28 ENCOUNTER — Encounter: Payer: Self-pay | Admitting: Family Medicine

## 2021-10-28 ENCOUNTER — Telehealth (INDEPENDENT_AMBULATORY_CARE_PROVIDER_SITE_OTHER): Payer: Medicare Other | Admitting: Family Medicine

## 2021-10-28 VITALS — Ht 67.0 in | Wt 229.0 lb

## 2021-10-28 DIAGNOSIS — R0600 Dyspnea, unspecified: Secondary | ICD-10-CM | POA: Diagnosis not present

## 2021-10-28 NOTE — Progress Notes (Signed)
Patient ID: Cynthia [name], female   DOB: [birth year], 67 y.o.   MRN: [id number] ? ? ?This visit type was conducted due to national recommendations for restrictions regarding the COVID-19 pandemic in an effort to limit this patient's exposure and mitigate transmission in our community.  ? ?Virtual Visit via Telephone Note ? ?I connected with [name] on 10/28/21 at  4:00 PM EDT by telephone and verified that I am speaking with the correct person using two identifiers. ?  ?I discussed the limitations, risks, security and privacy concerns of performing an evaluation and management service by telephone and the availability of in person appointments. I also discussed with the patient that there may be a patient responsible charge related to this service. The patient expressed understanding and agreed to proceed. ? ?Location patient: home ?Location provider: work or home office ?Participants present for the call: patient, provider ?Patient did not have a visit in the prior 7 days to address this/these issue(s). ? ? ?History of Present Illness: ? ?Tine' has history of obstructive sleep apnea, COPD, hypothyroidism, obesity, ongoing nicotine use, bipolar disorder.  She had called recently with some increased dyspnea.  No chest pains.  We had recommended that she schedule an office follow-up for 30-minute evaluation and possibly obtain additional studies such as chest x-ray and EKG to further assess.  She does have history of obstructive sleep apnea but states she is "claustrophobic "and could not tolerate CPAP.  Has never had any chest pain.  No swallowing difficulties.  Dyspnea is with activities but not at rest.  Denies any cough.  No fever.  No pleuritic pain.  No hemoptysis.  Appetite and weight stable. ? ?Past Medical History:  ?Diagnosis Date  ? Bipolar disorder (HCC)   ? COPD (chronic obstructive pulmonary disease) (HCC)   ? Depression   ? GERD (gastroesophageal reflux disease)   ? ?Past Surgical History:   ?Procedure Laterality Date  ? APPENDECTOMY    ? CHOLECYSTECTOMY    ? LUMBAR LAMINECTOMY    ? 6 surgeries  ? ? reports that she has been smoking cigarettes. She has never used smokeless tobacco. She reports that she does not drink alcohol and does not use drugs. ?family history includes Alzheimer's disease in her mother; Colon cancer in her father. ?Allergies  ?Allergen Reactions  ? Codeine Phosphate Nausea And Vomiting  ? ? ?  ?Observations/Objective: ?Patient sounds cheerful and well on the phone. ?I do not appreciate any SOB. ?Speech and thought processing are grossly intact. ?Patient reported vitals: ? ?Assessment and Plan: ? ?Dyspnea.  Patient has history of COPD.  Difficult to sort out from history whether this is primarily pulmonary versus cardiac.  We strongly recommended office follow-up to further assess and she will schedule.  She knows to go to the ER if symptoms become more urgent in the meantime. ? ?Follow Up Instructions: ? ? ? ?99441 5-10 ?99442 11-20 ?99443 21-30 ?I did not refer this patient for an OV in the next 24 hours for this/these issue(s). ? ?I discussed the assessment and treatment plan with the patient. The patient was provided an opportunity to ask questions and all were answered. The patient agreed with the plan and demonstrated an understanding of the instructions. ?  ?The patient was advised to call back or seek an in-person evaluation if the symptoms worsen or if the condition fails to improve as anticipated. ? ?I provided 13 minutes of non-face-to-face time during this encounter. ? ? ?Evelena Peat, MD  ? ?

## 2021-11-07 ENCOUNTER — Other Ambulatory Visit: Payer: Medicare Other

## 2021-11-07 DIAGNOSIS — E7800 Pure hypercholesterolemia, unspecified: Secondary | ICD-10-CM

## 2021-11-07 LAB — LIPID PANEL
Cholesterol: 155 mg/dL (ref 0–200)
HDL: 50.9 mg/dL (ref >39.00)
LDL Cholesterol: 80 mg/dL (ref 0–99)
NonHDL: 104.06
Total CHOL/HDL Ratio: 3
Triglycerides: 122 mg/dL (ref 0.0–149.0)
VLDL: 24.4 mg/dL (ref 0.0–40.0)

## 2021-11-07 LAB — HEPATIC FUNCTION PANEL
ALT: 22 U/L (ref 0–35)
AST: 23 U/L (ref 0–37)
Albumin: 4.1 g/dL (ref 3.5–5.2)
Alkaline Phosphatase: 77 U/L (ref 39–117)
Bilirubin, Direct: 0.1 mg/dL (ref 0.0–0.3)
Total Bilirubin: 0.4 mg/dL (ref 0.2–1.2)
Total Protein: 7.3 g/dL (ref 6.0–8.3)

## 2021-12-01 ENCOUNTER — Other Ambulatory Visit: Payer: Self-pay | Admitting: Family Medicine

## 2021-12-01 DIAGNOSIS — E039 Hypothyroidism, unspecified: Secondary | ICD-10-CM

## 2021-12-03 ENCOUNTER — Telehealth: Payer: Medicare Other | Admitting: Nurse Practitioner

## 2021-12-03 DIAGNOSIS — R112 Nausea with vomiting, unspecified: Secondary | ICD-10-CM

## 2021-12-03 NOTE — Progress Notes (Signed)
Tine,  If you have been vomiting for three days and cannot keep liquids down you will need to be seen in person for evaluation of dehydration.   I apologize for this inconvenience, but we want to assure you get the best care.   Based on what you shared with me, I feel your condition warrants further evaluation and I recommend that you be seen in a face to face visit.   NOTE: There will be NO CHARGE for this eVisit   If you are having a true medical emergency please call 911.      For an urgent face to face visit, [facility] has six urgent care centers for your convenience:     [facility] at [facility] Directions [phone] [address] [place], [place] [address]    [facility] Health Urgent Care Center [facility]) Get Driving Directions [phone] [address] [place], [place] [address]  [facility] Center [facility]) Get Driving Directions [phone] [address] [place],  [place]  [address]  [facility] at [facility] Get Driving Directions [phone] [address], [address] [place], [place] [address]   [facility] at [facility] Get Driving Directions  [phone] [address] [place], [place] [address]   [facility] Health Urgent Care at [facility] Directions [phone] [address], [address] [place], [place] [address]  Your MyChart E-visit questionnaire answers were reviewed by a board certified advanced clinical practitioner to complete your personal care plan based on your specific symptoms.  Thank you for using e-Visits.

## 2021-12-04 ENCOUNTER — Telehealth: Payer: Medicare Other | Admitting: Nurse Practitioner

## 2021-12-04 DIAGNOSIS — R12 Heartburn: Secondary | ICD-10-CM | POA: Diagnosis not present

## 2021-12-04 MED ORDER — OMEPRAZOLE 20 MG PO CPDR: 20.0000 mg | DELAYED_RELEASE_CAPSULE | Freq: Every day | ORAL | 3 refills | Status: DC

## 2021-12-04 NOTE — Progress Notes (Signed)
E-Visit for Heartburn  We are sorry that you are not feeling well.  Here is how we plan to help!  Based on what you shared with me it looks like you most likely have Gastroesophageal Reflux Disease (GERD)  Gastroesophageal reflux disease (GERD) happens when acid from your stomach flows up into the esophagus.  When acid comes in contact with the esophagus, the acid causes sorenss (inflammation) in the esophagus.  Over time, GERD may create small holes (ulcers) in the lining of the esophagus.  I have prescribed Omeprazole 20 mg one by mouth daily until you follow up with a provider.  Your symptoms should improve in the next day or two.  You can use antacids as needed until symptoms resolve.  Call us if your heartburn worsens, you have trouble swallowing, weight loss, spitting up blood or recurrent vomiting.  Home Care: May include lifestyle changes such as weight loss, quitting smoking and alcohol consumption Avoid foods and drinks that make your symptoms worse, such as: Caffeine or alcoholic drinks Chocolate Peppermint or mint flavorings Garlic and onions Spicy foods Citrus fruits, such as oranges, lemons, or limes Tomato-based foods such as sauce, chili, salsa and pizza Fried and fatty foods Avoid lying down for 3 hours prior to your bedtime or prior to taking a nap Eat small, frequent meals instead of a large meals Wear loose-fitting clothing.  Do not wear anything tight around your waist that causes pressure on your stomach. Raise the head of your bed 6 to 8 inches with wood blocks to help you sleep.  Extra pillows will not help.  Seek Help Right Away If: You have pain in your arms, neck, jaw, teeth or back Your pain increases or changes in intensity or duration You develop nausea, vomiting or sweating (diaphoresis) You develop shortness of breath or you faint Your vomit is green, yellow, black or looks like coffee grounds or blood Your stool is red, bloody or black  These  symptoms could be signs of other problems, such as heart disease, gastric bleeding or esophageal bleeding.  Make sure you : Understand these instructions. Will watch your condition. Will get help right away if you are not doing well or get worse.  Your e-visit answers were reviewed by a board certified advanced clinical practitioner to complete your personal care plan.  Depending on the condition, your plan could have included both over the counter or prescription medications.  If there is a problem please reply  once you have received a response from your provider.  Your safety is important to us.  If you have drug allergies check your prescription carefully.    You can use MyChart to ask questions about today's visit, request a non-urgent call back, or ask for a work or school excuse for 24 hours related to this e-Visit. If it has been greater than 24 hours you will need to follow up with your provider, or enter a new e-Visit to address those concerns.  You will get an e-mail in the next two days asking about your experience.  I hope that your e-visit has been valuable and will speed your recovery. Thank you for using e-visits. 5-10 minutes spent reviewing and documenting in chart.   

## 2021-12-13 ENCOUNTER — Encounter: Payer: Self-pay | Admitting: Family Medicine

## 2021-12-13 ENCOUNTER — Telehealth: Payer: Medicare Other | Admitting: Physician Assistant

## 2021-12-13 DIAGNOSIS — R1310 Dysphagia, unspecified: Secondary | ICD-10-CM | POA: Diagnosis not present

## 2021-12-13 MED ORDER — PREDNISOLONE 15 MG/5ML PO SOLN: 30.0000 mg | Freq: Two times a day (BID) | ORAL | 0 refills | Status: AC

## 2021-12-13 NOTE — Progress Notes (Signed)
Virtual Visit Consent   Cynthia [name], you are scheduled for a virtual visit with a [facility] provider today. Just as with appointments in the office, your consent must be obtained to participate. Your consent will be active for this visit and any virtual visit you may have with one of our providers in the next 365 days. If you have a MyChart account, a copy of this consent can be sent to you electronically.  As this is a virtual visit, video technology does not allow for your provider to perform a traditional examination. This may limit your provider's ability to fully assess your condition. If your provider identifies any concerns that need to be evaluated in person or the need to arrange testing (such as labs, EKG, etc.), we will make arrangements to do so. Although advances in technology are sophisticated, we cannot ensure that it will always work on either your end or our end. If the connection with a video visit is poor, the visit may have to be switched to a telephone visit. With either a video or telephone visit, we are not always able to ensure that we have a secure connection.  By engaging in this virtual visit, you consent to the provision of healthcare and authorize for your insurance to be billed (if applicable) for the services provided during this visit. Depending on your insurance coverage, you may receive a charge related to this service.  I need to obtain your verbal consent now. Are you willing to proceed with your visit today? Cynthia [name] has provided verbal consent on 12/13/2021 for a virtual visit (video or telephone). Margaretann Loveless, PA-C  Date: 12/13/2021 3:07 PM  Virtual Visit via Video Note   I, Margaretann Loveless, connected with  Cynthia [name]  ([id number], [id number]) on 12/13/21 at  3:00 PM EDT by a video-enabled telemedicine application and verified that I am speaking with the correct person using two identifiers.  Location: Patient: Virtual Visit  Location Patient: Home Provider: Virtual Visit Location Provider: Home Office   I discussed the limitations of evaluation and management by telemedicine and the availability of in person appointments. The patient expressed understanding and agreed to proceed.    History of Present Illness: SAAYA [name] is a 66 y.o. who identifies as a female who was assigned female at birth, and is being seen today for sore throat. Had episodes of nausea and vomiting for over 6 days last week, now improved. Now feels like throat is still swollen and causing issues with swallowing harder/denser foods. Having no other symptoms indicating infection.    Problems:  Patient Active Problem List   Diagnosis Date Noted   Primary osteoarthritis of both hands 06/15/2019   Chronic pain syndrome 04/08/2019   Hypothyroid 09/05/2016   OSA (obstructive sleep apnea) 03/19/2016   Tobacco user 03/19/2016   Obesity (BMI 30-39.9) 06/27/2013   Urgency incontinence 12/22/2012   TOBACCO USER 12/07/2008   GERD 02/11/2008   DEPRESSION 11/21/2007   Bipolar 2 disorder (HCC) 11/15/2007   COPD (chronic obstructive pulmonary disease) (HCC) 11/15/2007    Allergies:  Allergies  Allergen Reactions   Codeine Phosphate Nausea And Vomiting   Medications:  Current Outpatient Medications:    prednisoLONE (PRELONE) 15 MG/5ML SOLN, Take 10 mLs (30 mg total) by mouth 2 (two) times daily for 5 days., Disp: 100 mL, Rfl: 0   albuterol (VENTOLIN HFA) 108 (90 Base) MCG/ACT inhaler, INHALE 2 PUFFS BY MOUTH EVERY 4 HOURS AS NEEDED FOR  WHEEZE OR FOR SHORTNESS OF BREATH, Disp: 8.5 each, Rfl: 4   ARIPiprazole (ABILIFY) 5 MG tablet, Take 5 mg by mouth every morning., Disp: , Rfl: 5   busPIRone (BUSPAR) 10 MG tablet, PLEASE SEE ATTACHED FOR DETAILED DIRECTIONS, Disp: , Rfl:    famotidine (PEPCID) 20 MG tablet, TAKE 1 TABLET BY MOUTH TWICE A DAY, Disp: 180 tablet, Rfl: 1   HYDROmorphone, PF, (DILAUDID HP) 250 MG injection, Inject as directed.  PT CURRENTLY WEARS A PAIN PUMP., Disp: , Rfl:    hydrOXYzine (VISTARIL) 50 MG capsule, Take 50 mg by mouth. Take 2 tablets in the morning, one in the evening, Disp: , Rfl: 0   levothyroxine (SYNTHROID) 50 MCG tablet, TAKE 1 TABLET BY MOUTH EVERY DAY, Disp: 90 tablet, Rfl: 1   losartan (COZAAR) 50 MG tablet, TAKE 1 TABLET BY MOUTH EVERY DAY, Disp: 90 tablet, Rfl: 1   mirtazapine (REMERON) 30 MG tablet, Take 1 tablet by mouth daily., Disp: , Rfl:    naloxone (NARCAN) nasal spray 4 mg/0.1 mL, Place 1 spray into the nose once., Disp: , Rfl:    NUCYNTA 75 MG tablet, Take 1 tablet by mouth 2 (two) times daily., Disp: , Rfl: 0   omeprazole (PRILOSEC) 20 MG capsule, Take 1 capsule (20 mg total) by mouth daily., Disp: 30 capsule, Rfl: 3   rosuvastatin (CRESTOR) 10 MG tablet, TAKE 1 TABLET BY MOUTH EVERY DAY, Disp: 90 tablet, Rfl: 1   venlafaxine XR (EFFEXOR-XR) 150 MG 24 hr capsule, 150 mg. Two tablets in the morning, Disp: , Rfl:   Observations/Objective: Patient is well-developed, well-nourished in no acute distress.  Resting comfortably at home.  Head is normocephalic, atraumatic.  No labored breathing.  Speech is clear and coherent with logical content.  Patient is alert and oriented at baseline.    Assessment and Plan: 1. Dysphagia, unspecified type - prednisoLONE (PRELONE) 15 MG/5ML SOLN; Take 10 mLs (30 mg total) by mouth 2 (two) times daily for 5 days.  Dispense: 100 mL; Refill: 0  - Secondary to trauma from vomiting last week - Prednisolone prescribed for inflammation - Seek in person evaluation if these symptoms worsen or fail to improve  Follow Up Instructions: I discussed the assessment and treatment plan with the patient. The patient was provided an opportunity to ask questions and all were answered. The patient agreed with the plan and demonstrated an understanding of the instructions.  A copy of instructions were sent to the patient via MyChart unless otherwise noted below.     The patient was advised to call back or seek an in-person evaluation if the symptoms worsen or if the condition fails to improve as anticipated.  Time:  I spent 10 minutes with the patient via telehealth technology discussing the above problems/concerns.    Margaretann Loveless, PA-C

## 2021-12-13 NOTE — Patient Instructions (Signed)
Cynthia MinionKristine L [name], thank you for joining Margaretann LovelessJennifer M Ward Boissonneault, PA-C for today's virtual visit.  While this provider is not your primary care provider (PCP), if your PCP is located in our provider database this encounter information will be shared with them immediately following your visit.  Consent: (Patient) Cynthia MinionKristine L [name] provided verbal consent for this virtual visit at the beginning of the encounter.  Current Medications:  Current Outpatient Medications:    prednisoLONE (PRELONE) 15 MG/5ML SOLN, Take 10 mLs (30 mg total) by mouth 2 (two) times daily for 5 days., Disp: 100 mL, Rfl: 0   albuterol (VENTOLIN HFA) 108 (90 Base) MCG/ACT inhaler, INHALE 2 PUFFS BY MOUTH EVERY 4 HOURS AS NEEDED FOR WHEEZE OR FOR SHORTNESS OF BREATH, Disp: 8.5 each, Rfl: 4   ARIPiprazole (ABILIFY) 5 MG tablet, Take 5 mg by mouth every morning., Disp: , Rfl: 5   busPIRone (BUSPAR) 10 MG tablet, PLEASE SEE ATTACHED FOR DETAILED DIRECTIONS, Disp: , Rfl:    famotidine (PEPCID) 20 MG tablet, TAKE 1 TABLET BY MOUTH TWICE A DAY, Disp: 180 tablet, Rfl: 1   HYDROmorphone, PF, (DILAUDID HP) 250 MG injection, Inject as directed. PT CURRENTLY WEARS A PAIN PUMP., Disp: , Rfl:    hydrOXYzine (VISTARIL) 50 MG capsule, Take 50 mg by mouth. Take 2 tablets in the morning, one in the evening, Disp: , Rfl: 0   levothyroxine (SYNTHROID) 50 MCG tablet, TAKE 1 TABLET BY MOUTH EVERY DAY, Disp: 90 tablet, Rfl: 1   losartan (COZAAR) 50 MG tablet, TAKE 1 TABLET BY MOUTH EVERY DAY, Disp: 90 tablet, Rfl: 1   mirtazapine (REMERON) 30 MG tablet, Take 1 tablet by mouth daily., Disp: , Rfl:    naloxone (NARCAN) nasal spray 4 mg/0.1 mL, Place 1 spray into the nose once., Disp: , Rfl:    NUCYNTA 75 MG tablet, Take 1 tablet by mouth 2 (two) times daily., Disp: , Rfl: 0   omeprazole (PRILOSEC) 20 MG capsule, Take 1 capsule (20 mg total) by mouth daily., Disp: 30 capsule, Rfl: 3   rosuvastatin (CRESTOR) 10 MG tablet, TAKE 1 TABLET BY MOUTH EVERY  DAY, Disp: 90 tablet, Rfl: 1   venlafaxine XR (EFFEXOR-XR) 150 MG 24 hr capsule, 150 mg. Two tablets in the morning, Disp: , Rfl:    Medications ordered in this encounter:  Meds ordered this encounter  Medications   prednisoLONE (PRELONE) 15 MG/5ML SOLN    Sig: Take 10 mLs (30 mg total) by mouth 2 (two) times daily for 5 days.    Dispense:  100 mL    Refill:  0    Order Specific Question:   Supervising Provider    Answer:   Hyacinth MeekerMILLER, BRIAN [[id number]]     *If you need refills on other medications prior to your next appointment, please contact your pharmacy*  Follow-Up: Call back or seek an in-person evaluation if the symptoms worsen or if the condition fails to improve as anticipated.  Other Instructions Dysphagia  Dysphagia is trouble swallowing. This condition occurs when solids and liquids stick in a person's throat on the way down to the stomach, or when food takes longer to get to the stomach than usual. You may have problems swallowing food, liquids, or both. You may also have pain while trying to swallow. It may take you more time and effort to swallow something. What are the causes? This condition may be caused by: Muscle problems. These may make it difficult for you to move food and liquids through  the esophagus, which is the tube that connects your mouth to your stomach. Blockages. You may have ulcers, scar tissue, or inflammation that blocks the normal passage of food and liquids. Causes of these problems include: Acid reflux from your stomach into your esophagus (gastroesophageal reflux). Infections. Radiation treatment for cancer. Medicines taken without enough fluids to wash them down into your stomach. Stroke. This can affect the nerves and make it difficult to swallow. Nerve problems. These prevent signals from being sent to the muscles of your esophagus to squeeze (contract) and move what you swallow down to your stomach. Globus pharyngeus. This is a common problem that  involves a feeling like something is stuck in your throat or a sense of trouble with swallowing, even though nothing is wrong with the swallowing passages. Certain conditions, such as cerebral palsy or Parkinson's disease. What are the signs or symptoms? Common symptoms of this condition include: A feeling that solids or liquids are stuck in your throat on the way down to the stomach. Pain while swallowing. Coughing or gagging while trying to swallow. Other symptoms include: Food moving back from your stomach to your mouth (regurgitation). Noises coming from your throat. Chest discomfort when swallowing. A feeling of fullness when swallowing. Drooling, especially when the throat is blocked. Heartburn. How is this diagnosed? This condition may be diagnosed by: Barium swallow X-ray. In this test, you will swallow a white liquid that sticks to the inside of your esophagus. X-ray images are then taken. Endoscopy. In this test, a flexible telescope is inserted down your throat to look at your esophagus and your stomach. CT scans or an MRI. How is this treated? Treatment for dysphagia depends on the cause of this condition: If the dysphagia is caused by acid reflux or infection, medicines may be used. These may include antibiotics or heartburn medicines. If the dysphagia is caused by problems with the muscles, swallowing therapy may be used to help you strengthen your swallowing muscles. You may have to do specific exercises to strengthen the muscles or stretch them. If the dysphagia is caused by a blockage or mass, procedures to remove the blockage may be done. You may need surgery and a feeding tube. You may need to make diet changes. Ask your health care provider for specific instructions. Follow these instructions at home: Medicines Take over-the-counter and prescription medicines only as told by your health care provider. If you were prescribed an antibiotic medicine, take it as told by  your health care provider. Do not stop taking the antibiotic even if you start to feel better. Eating and drinking  Make any diet changes as told by your health care provider. Work with a diet and nutrition specialist (dietitian) to create an eating plan that will help you get the nutrients you need in order to stay healthy. Eat soft foods that are easier to swallow. Cut your food into small pieces and eat slowly. Take small bites. Eat and drink only when you are sitting upright. Do not drink alcohol or caffeine. If you need help quitting, ask your health care provider. General instructions Check your weight every day to make sure you are not losing weight. Do not use any products that contain nicotine or tobacco. These products include cigarettes, chewing tobacco, and vaping devices, such as e-cigarettes. If you need help quitting, ask your health care provider. Keep all follow-up visits. This is important. Contact a health care provider if: You lose weight because you cannot swallow. You cough when you  drink liquids. You cough up partially digested food. Get help right away if: You cannot swallow your saliva. You have shortness of breath, a fever, or both. Your voice is hoarse and you have trouble swallowing. These symptoms may represent a serious problem that is an emergency. Do not wait to see if the symptoms will go away. Get medical help right away. Call your local emergency services (911 in the [place] Do not drive yourself to the hospital. Summary Dysphagia is trouble swallowing. This condition occurs when solids and liquids stick in a person's throat on the way down to the stomach. You may cough or gag while trying to swallow. Dysphagia has many possible causes. Treatment for dysphagia depends on the cause of the condition. Keep all follow-up visits. This is important. This information is not intended to replace advice given to you by your health care provider. Make sure you  discuss any questions you have with your health care provider. Document Revised: 02/18/2020 Document Reviewed: 02/18/2020 Elsevier Patient Education  2023 [employer]    If you have been instructed to have an in-person evaluation today at a local Urgent Care facility, please use the link below. It will take you to a list of all of our available [facility], including address, phone number and hours of operation. Please do not delay care.  [facility]  If you or a family member do not have a primary care provider, use the link below to schedule a visit and establish care. When you choose a [facility] primary care physician or advanced practice provider, you gain a long-term partner in health. Find a Primary Care Provider  Learn more about [facility]'s in-office and virtual care options: [facility] - Get Care Now

## 2021-12-23 ENCOUNTER — Telehealth: Payer: Self-pay

## 2021-12-23 NOTE — Telephone Encounter (Signed)
--  Caller states that she started with SOB over the weekend. She is also coughing more than usual.  12/23/2021 12:30:44 PM See HCP within 4 Hours (or PCP triage) Leilani Merl, [place], [place]  Referrals REFERRED TO PCP OFFICE  12/23/21 1448 - Had VV on 12/13/21 with [facility] for dysphagia; given Prednisone for inflammation. Pt endorses dry cough & SOB (sometimes at rest) x 4-5 days. Denies h/a or fever. I did not appreciate SOB during phone call. Appt made for in person evaluation with Dr Elease Hashimoto on 01/03/22 at 10:45AM. Pt advised to seek UC or ED if SOB worsens before appt; pt verb understanding. Will send to PCP for further triage if needed.

## 2021-12-24 ENCOUNTER — Ambulatory Visit: Payer: Medicare Other | Admitting: Family Medicine

## 2021-12-24 NOTE — Telephone Encounter (Signed)
Pt is scheduled to be seen today.

## 2021-12-25 ENCOUNTER — Ambulatory Visit: Payer: Medicare Other | Admitting: Family Medicine

## 2021-12-27 ENCOUNTER — Encounter: Payer: Self-pay | Admitting: Family Medicine

## 2021-12-27 NOTE — Telephone Encounter (Signed)
noted 

## 2022-01-22 ENCOUNTER — Encounter: Payer: Self-pay | Admitting: Family Medicine

## 2022-01-30 ENCOUNTER — Other Ambulatory Visit: Payer: Self-pay | Admitting: Family Medicine

## 2022-02-03 ENCOUNTER — Ambulatory Visit: Payer: Medicare Other | Admitting: Family Medicine

## 2022-02-10 ENCOUNTER — Other Ambulatory Visit: Payer: Self-pay | Admitting: Family Medicine

## 2022-03-22 ENCOUNTER — Other Ambulatory Visit: Payer: Self-pay | Admitting: Nurse Practitioner

## 2022-04-02 ENCOUNTER — Encounter: Payer: Self-pay | Admitting: Family Medicine

## 2022-04-02 ENCOUNTER — Ambulatory Visit: Payer: Medicare Other | Admitting: Family Medicine

## 2022-04-03 ENCOUNTER — Telehealth: Payer: Self-pay

## 2022-04-03 NOTE — Telephone Encounter (Signed)
--  Caller states that she is having sharp pain in the left side of her chest when she inhales.  03/30/2022 10:16:17 AM Go to ED Now Mariel Sleet, RN, Erasmo Downer  Referrals [facility] in [place]  04/03/22 at 1118 - Pt states pain has gotten better. Pt states she tried to go to ED, but the [facility] driver didn't know how to get there so she requested to go home.Pt declines appt now for transportation reasons & states she will call when she can find a ride.

## 2022-04-28 ENCOUNTER — Other Ambulatory Visit: Payer: Self-pay | Admitting: Family Medicine

## 2022-04-29 ENCOUNTER — Encounter: Payer: Self-pay | Admitting: Family Medicine

## 2022-04-30 ENCOUNTER — Ambulatory Visit: Payer: Medicare Other | Admitting: Family Medicine

## 2022-05-07 ENCOUNTER — Ambulatory Visit: Payer: Medicare Other

## 2022-05-07 ENCOUNTER — Ambulatory Visit (INDEPENDENT_AMBULATORY_CARE_PROVIDER_SITE_OTHER): Payer: Medicare Other | Admitting: Family Medicine

## 2022-05-07 ENCOUNTER — Encounter: Payer: Self-pay | Admitting: Family Medicine

## 2022-05-07 VITALS — BP 130/70 | HR 108 | Temp 98.2°F | Ht 67.0 in | Wt 218.0 lb

## 2022-05-07 DIAGNOSIS — J441 Chronic obstructive pulmonary disease with (acute) exacerbation: Secondary | ICD-10-CM

## 2022-05-07 DIAGNOSIS — R0600 Dyspnea, unspecified: Secondary | ICD-10-CM

## 2022-05-07 DIAGNOSIS — E039 Hypothyroidism, unspecified: Secondary | ICD-10-CM | POA: Diagnosis not present

## 2022-05-07 DIAGNOSIS — J449 Chronic obstructive pulmonary disease, unspecified: Secondary | ICD-10-CM | POA: Diagnosis not present

## 2022-05-07 LAB — CBC WITH DIFFERENTIAL/PLATELET
Basophils Absolute: 0.1 10*3/uL (ref 0.0–0.1)
Basophils Relative: 0.6 % (ref 0.0–3.0)
Eosinophils Absolute: 0.6 10*3/uL (ref 0.0–0.7)
Eosinophils Relative: 6 % — ABNORMAL HIGH (ref 0.0–5.0)
HCT: 46.9 % — ABNORMAL HIGH (ref 36.0–46.0)
Hemoglobin: 15.5 g/dL — ABNORMAL HIGH (ref 12.0–15.0)
Lymphocytes Relative: 13.1 % (ref 12.0–46.0)
Lymphs Abs: 1.4 10*3/uL (ref 0.7–4.0)
MCHC: 33.1 g/dL (ref 30.0–36.0)
MCV: 85.1 fl (ref 78.0–100.0)
Monocytes Absolute: 0.9 10*3/uL (ref 0.1–1.0)
Monocytes Relative: 7.8 % (ref 3.0–12.0)
Neutro Abs: 7.9 10*3/uL — ABNORMAL HIGH (ref 1.4–7.7)
Neutrophils Relative %: 72.5 % (ref 43.0–77.0)
Platelets: 369 10*3/uL (ref 150.0–400.0)
RBC: 5.51 Mil/uL — ABNORMAL HIGH (ref 3.87–5.11)
RDW: 14.9 % (ref 11.5–15.5)
WBC: 10.9 10*3/uL — ABNORMAL HIGH (ref 4.0–10.5)

## 2022-05-07 LAB — TSH: TSH: 3.71 u[IU]/mL (ref 0.35–5.50)

## 2022-05-07 LAB — COMPREHENSIVE METABOLIC PANEL
ALT: 15 U/L (ref 0–35)
AST: 17 U/L (ref 0–37)
Albumin: 4 g/dL (ref 3.5–5.2)
Alkaline Phosphatase: 81 U/L (ref 39–117)
BUN: 12 mg/dL (ref 6–23)
CO2: 28 mEq/L (ref 19–32)
Calcium: 9.3 mg/dL (ref 8.4–10.5)
Chloride: 101 mEq/L (ref 96–112)
Creatinine, Ser: 0.7 mg/dL (ref 0.40–1.20)
GFR: 89 mL/min (ref >60.00)
Glucose, Bld: 95 mg/dL (ref 70–99)
Potassium: 4 mEq/L (ref 3.5–5.1)
Sodium: 139 mEq/L (ref 135–145)
Total Bilirubin: 0.3 mg/dL (ref 0.2–1.2)
Total Protein: 7.5 g/dL (ref 6.0–8.3)

## 2022-05-07 MED ORDER — TRELEGY ELLIPTA 100-62.5-25 MCG/ACT IN AEPB: 1.0000 | INHALATION_SPRAY | Freq: Every day | RESPIRATORY_TRACT | 11 refills | Status: DC

## 2022-05-07 MED ORDER — AMOXICILLIN 875 MG PO TABS: 875.0000 mg | ORAL_TABLET | Freq: Two times a day (BID) | ORAL | 0 refills | Status: AC

## 2022-05-07 MED ORDER — PREDNISONE 20 MG PO TABS: ORAL_TABLET | ORAL | 0 refills | Status: DC

## 2022-05-07 NOTE — Patient Instructions (Signed)
PLEASE SET UP 30 MINUTE OFFICE FOLLOW UP FOR TWO WEEKS.  Use Trelegy one puff once daily and rinse mouth after use.    Consider home pulse oximeter to monitor oxygen.

## 2022-05-07 NOTE — Progress Notes (Signed)
Established Patient Office Visit  Subjective   Patient ID: Cynthia [name], female    DOB: 1956-03-27  Age: 66 y.o. MRN: [id number]  Chief Complaint  Patient presents with   Shortness of Breath    X2 weeks     HPI   Cynthia [name] seen for complaints of increasing shortness of breath past couple weeks.  She has longstanding history of COPD and ongoing nicotine use.  History of poor compliance with follow-up.  She also has chronic pain syndrome and is followed by pain management and apparently on fairly high-dose opioids.  She started having some increased shortness of breath couple weeks ago but actually has some chronic dyspnea and chronic cough she states for many years.  No hemoptysis.  No fevers or chills.  Appetite stable.  Her shortness of breath is predominantly with activity but improved some after rest.  She is currently only using albuterol rescue inhaler.  She was on Advair previously.  Her other medical problems include history of osteoarthritis, hypothyroidism, bipolar disorder, recurrent depression.  She is followed by psychiatry as well.  She brings to our attention that her chronic pain management doctor ordered CT myelogram of thoracic spine last January.  This showed "ovoid nodular lesion in the posterior left upper lobe/left suprahilar region measuring 15 mm ".  She states she was scheduled for follow-up CT scan but had conflict with scheduling and never went back to reschedule.  She has hypothyroidism and is on replacement.  Also has hyperlipidemia treated with rosuvastatin.  Past Medical History:  Diagnosis Date   Bipolar disorder ([place])    COPD (chronic obstructive pulmonary disease) (HCC)    Depression    GERD (gastroesophageal reflux disease)    Past Surgical History:  Procedure Laterality Date   APPENDECTOMY     CHOLECYSTECTOMY     LUMBAR LAMINECTOMY     6 surgeries    reports that she has been smoking cigarettes. She has never used smokeless tobacco. She  reports that she does not drink alcohol and does not use drugs. family history includes Alzheimer's disease in her mother; Colon cancer in her father. Allergies  Allergen Reactions   Codeine Phosphate Nausea And Vomiting  ] Review of Systems  Constitutional:  Negative for chills, fever and weight loss.  Respiratory:  Positive for cough, sputum production, shortness of breath and wheezing. Negative for hemoptysis.   Cardiovascular:  Negative for chest pain.  Gastrointestinal:  Negative for abdominal pain.  Genitourinary:  Negative for dysuria and hematuria.  Skin:  Negative for rash.      Objective:     BP 130/70 (BP Location: Left Arm, Patient Position: Sitting, Cuff Size: Normal)   Pulse (!) 108   Temp 98.2 F (36.8 C) (Oral)   Ht 5\' 7"  (1.702 m)   Wt 218 lb (98.9 kg)   SpO2 90%   BMI 34.14 kg/m  BP Readings from Last 3 Encounters:  05/07/22 130/70  09/03/21 112/80  05/10/21 (!) 160/80   Wt Readings from Last 3 Encounters:  05/07/22 218 lb (98.9 kg)  10/28/21 229 lb (103.9 kg)  10/09/21 229 lb (103.9 kg)      Physical Exam Vitals reviewed.  Constitutional:      General: She is not in acute distress. Cardiovascular:     Rate and Rhythm: Normal rate.     Comments: Rate around 100. Pulmonary:     Breath sounds: Wheezing present.     Comments: Has some diffuse expiratory wheezes.  No rales. Musculoskeletal:     Right lower leg: No edema.     Left lower leg: No edema.  Neurological:     Mental Status: She is alert.      No results found for any visits on 05/07/22.  Last CBC Lab Results  Component Value Date   WBC 6.4 11/25/2017   HGB 14.7 11/25/2017   HCT 43.4 11/25/2017   MCV 81.2 11/25/2017   MCH 27.8 06/17/2017   RDW 15.2 11/25/2017   PLT 324.0 0000000   Last metabolic panel Lab Results  Component Value Date   GLUCOSE 98 09/03/2021   NA 136 09/03/2021   K 4.1 09/03/2021   CL 98 09/03/2021   CO2 35 (H) 09/03/2021   BUN 7 09/03/2021    CREATININE 0.80 09/03/2021   GFRNONAA >60 06/17/2017   CALCIUM 9.2 09/03/2021   PROT 7.3 11/07/2021   ALBUMIN 4.1 11/07/2021   BILITOT 0.4 11/07/2021   ALKPHOS 77 11/07/2021   AST 23 11/07/2021   ALT 22 11/07/2021   ANIONGAP 8 06/17/2017   Last lipids Lab Results  Component Value Date   CHOL 155 11/07/2021   HDL 50.90 11/07/2021   LDLCALC 80 11/07/2021   TRIG 122.0 11/07/2021   CHOLHDL 3 11/07/2021   Last thyroid functions Lab Results  Component Value Date   TSH 3.67 09/03/2021      The 10-year ASCVD risk score (Arnett DK, et al., 2019) is: 16.3%    Assessment & Plan:   Problem List Items Addressed This Visit       Unprioritized   Hypothyroid - Primary   Relevant Orders   TSH   COPD (chronic obstructive pulmonary disease) ([place])   Relevant Medications   predniSONE (DELTASONE) 20 MG tablet   Fluticasone-Umeclidin-Vilant (TRELEGY ELLIPTA) 100-62.5-25 MCG/ACT AEPB   Other Relevant Orders   DG Chest 2 View   Other Visit Diagnoses     Dyspnea, unspecified type       Relevant Orders   CBC with Differential/Platelet   CMP   Acute exacerbation of chronic obstructive pulmonary disease (COPD) ([place])       Relevant Medications   predniSONE (DELTASONE) 20 MG tablet   Fluticasone-Umeclidin-Vilant (TRELEGY ELLIPTA) 100-62.5-25 MCG/ACT AEPB     Patient has longstanding history of COPD with ongoing nicotine use.  She has chronic cough and dyspnea but worsening over the past couple weeks.  Has had more productive cough occasionally brown sputum past couple weeks but no documented fever.  Pulse oximetry today after rest low 90s.  She feels that she is near her baseline at this time at rest  -Consider home pulse oximeter to monitor and she will check into getting this today -Be in touch if consistent O2 sat is less than 90% -Start Augmentin 875 mg twice daily for 7 days -Prednisone taper at 60 mg daily -Provided sample of Trelegy 100 mg 1 puff once daily.  We instructed her  on use.  Provided 1 month sample.  Also reminded to rinse mouth after use -Continue Ventolin MDI rescue inhaler as needed -We did discuss possible home nebulizer with DuoNeb but at this point she would like to try inhaler above -Obtain follow-up chest x-ray -we will likely need follow-up CT lung with IV contrast to further evaluate but will wait on chest x-ray results first  -Check further labs today with CBC, CMP, TSH -Set up 2-week follow-up to reassess. -Flu vaccine at follow-up and we also need to address Prevnar 20 at that  time  Return in about 2 weeks (around 05/21/2022).    Carolann Littler, MD

## 2022-05-24 ENCOUNTER — Other Ambulatory Visit: Payer: Self-pay | Admitting: Family Medicine

## 2022-05-24 DIAGNOSIS — E039 Hypothyroidism, unspecified: Secondary | ICD-10-CM

## 2022-06-02 ENCOUNTER — Telehealth: Payer: Self-pay

## 2022-06-02 NOTE — Telephone Encounter (Signed)
Transition Care Management Unsuccessful Follow-up Telephone Call  Date of discharge and from where:  05/31/22 from [facility] for Acute respiratory failure with hypoxia  Attempts:  1st Attempt  Reason for unsuccessful TCM follow-up call:  Unable to leave message

## 2022-06-03 ENCOUNTER — Encounter: Payer: Self-pay | Admitting: Family Medicine

## 2022-06-03 NOTE — Telephone Encounter (Addendum)
ATC but could not reach at this time due to mailbox being full.

## 2022-06-04 NOTE — Telephone Encounter (Signed)
Transition Care Management Follow-up Telephone Call Date of discharge and from where: 05/31/22 from [facility] for Acute respiratory failure with hypoxia  How have you been since you were released from the hospital? Pt states she doing very well.  Any questions or concerns? No  Items Reviewed: Did the pt receive and understand the discharge instructions provided? Yes  Medications obtained and verified? Yes  Other? Yes  Pt called triage nurse today regarding her catheter partially slipping out. Pt has appt today with urologist. Any new allergies since your discharge? No  Dietary orders reviewed? Yes Do you have support at home? Yes   Home Care and Equipment/Supplies: Were home health services ordered? yes If so, what is the name of the agency? [facility]  Has the agency set up a time to come to the patient's home? no Were any new equipment or medical supplies ordered?  Yes: Oxygen What is the name of the medical supply agency? Pt doesn't know right off  Were you able to get the supplies/equipment? Yes, received before leaving hospital Do you have any questions related to the use of the equipment or supplies? No  Functional Questionnaire: (I = Independent and D = Dependent) ADLs: I  Bathing/Dressing- I  Meal Prep- I  Eating- I  Maintaining continence- I  Transferring/Ambulation- I  Managing Meds- I  Follow up appointments reviewed:  PCP Hospital f/u appt confirmed? Yes  Scheduled to see Dr Docia Furl on 06/10/22 @ 1130. Specialist Hospital f/u appt confirmed? Yes   Are transportation arrangements needed? No  If their condition worsens, is the pt aware to call PCP or go to the Emergency Dept.? Yes Was the patient provided with contact information for the PCP's office or ED? Yes Was to pt encouraged to call back with questions or concerns? Yes

## 2022-06-04 NOTE — Telephone Encounter (Signed)
I was able to reach the patient today. She is coming in for HFU on 06/10/22. See TOC call if needed.

## 2022-06-10 ENCOUNTER — Inpatient Hospital Stay: Payer: Medicare Other | Admitting: Family Medicine

## 2022-06-13 ENCOUNTER — Telehealth: Payer: Self-pay | Admitting: Family Medicine

## 2022-06-13 NOTE — Telephone Encounter (Signed)
Fleet Contras informed of the message below

## 2022-06-13 NOTE — Telephone Encounter (Signed)
Left a message with Lyla Son at [facility] for Fleet Contras to return my call.

## 2022-06-13 NOTE — Telephone Encounter (Signed)
[place] for [facility] call with a FYI pt want to keep dr.Burchette ask her attending Physician .

## 2022-06-16 ENCOUNTER — Telehealth: Payer: Self-pay

## 2022-06-16 NOTE — Telephone Encounter (Signed)
Transition Care Management Unsuccessful Follow-up Telephone Call  Date of discharge and from where:  TCM DC [facility] 06-13-22 Dx: PNA left lower lobe due to infectious organism  Attempts:  1st Attempt  Reason for unsuccessful TCM follow-up call:  Unable to leave message   Woodfin Ganja LPN [facility] Nurse Health Advisor Direct Dial [phone]

## 2022-06-17 NOTE — Telephone Encounter (Signed)
Transition Care Management Unsuccessful Follow-up Telephone Call  Date of discharge and from where:   TCM DC [facility] 06-13-22 Dx: PNA left lower lobe due to infectious organism     Attempts:  2nd Attempt  Reason for unsuccessful TCM follow-up call:  Unable to leave message   Woodfin Ganja LPN [facility] Nurse Health Advisor Direct Dial [phone]

## 2022-06-18 ENCOUNTER — Inpatient Hospital Stay: Payer: Medicare Other | Admitting: Family Medicine

## 2022-06-18 NOTE — Telephone Encounter (Signed)
Transition Care Management Unsuccessful Follow-up Telephone Call  Date of discharge and from where:    TCM DC [facility] 06-13-22 Dx: PNA left lower lobe due to infectious organism    Attempts:  3rd Attempt  Reason for unsuccessful TCM follow-up call:  Unable to leave message   Woodfin Ganja LPN [facility] Nurse Health Advisor Direct Dial [phone]

## 2022-06-20 ENCOUNTER — Ambulatory Visit (INDEPENDENT_AMBULATORY_CARE_PROVIDER_SITE_OTHER): Payer: Medicare Other | Admitting: Family Medicine

## 2022-06-20 ENCOUNTER — Encounter: Payer: Self-pay | Admitting: Family Medicine

## 2022-06-20 VITALS — BP 144/84 | HR 97 | Temp 98.3°F | Ht 67.0 in | Wt 210.6 lb

## 2022-06-20 DIAGNOSIS — R918 Other nonspecific abnormal finding of lung field: Secondary | ICD-10-CM | POA: Diagnosis not present

## 2022-06-20 DIAGNOSIS — J449 Chronic obstructive pulmonary disease, unspecified: Secondary | ICD-10-CM | POA: Diagnosis not present

## 2022-06-20 MED ORDER — TRELEGY ELLIPTA 100-62.5-25 MCG/ACT IN AEPB: 1.0000 | INHALATION_SPRAY | Freq: Every day | RESPIRATORY_TRACT | 11 refills | Status: DC

## 2022-06-20 NOTE — Patient Instructions (Signed)
Follow up for any fever or increasing shortness of breath. 

## 2022-06-20 NOTE — Progress Notes (Signed)
Established Patient Office Visit  Subjective   Patient ID: Cynthia [name], female    DOB: [birth year]  Age: 66 y.o. MRN: [id number]  Chief Complaint  Patient presents with   Hospitalization Follow-up    HPI   Cynthia [name] has history of COPD, obstructive sleep apnea, GERD, hypothyroidism, bipolar disorder, chronic pain syndrome who is here following a couple recent admissions.  She was admitted on 05-22-2022 with acute respiratory failure with hypoxia.  She was treated with broad-spectrum antibiotics.  CT scan revealed 6 x 6 x 5 cm left suprahilar mass with postobstructive pneumonia.  She eventually improved and was discharged home after 9 days.  She states that she received her flu vaccine during that hospitalization.  She has home oxygen but is still waiting for portable.  She was then readmitted 22 November with left lower lobe pneumonia and sepsis.  Her initial blood pressure was 74/50. She improved with antibiotics and fluid resuscitation.  Patient refused further workup and treatment of her lung cancer.  She initially agreed to palliative care but then agreed to hospice.  She feels stable at this time respiratory wise.  She is using Trelegy and also has home nebulizer for albuterol.  No fever past few days.  She is using her oxygen consistently at home.  She has chronic pain syndrome which is managed by Dilaudid pump and also takes Nucynta  Past Medical History:  Diagnosis Date   Bipolar disorder (H[facility])    COPD (chronic obstructive pulmonary disease) (H[facility])    Depression    GERD (gastroesophageal reflux disease)    Past Surgical History:  Procedure Laterality Date   APPENDECTOMY     CHOLECYSTECTOMY     LUMBAR LAMINECTOMY     6 surgeries    reports that she has been smoking cigarettes. She has never used smokeless tobacco. She reports that she does not drink alcohol and does not use drugs. family history includes Alzheimer's disease in her mother; Colon cancer in her  father. Allergies  Allergen Reactions   Codeine Phosphate Nausea And Vomiting    Review of Systems  Constitutional:  Negative for chills and fever.  Respiratory:  Negative for cough, hemoptysis and wheezing.   Cardiovascular:  Negative for chest pain.      Objective:     BP (!) 144/84 (BP Location: Left Arm, Patient Position: Sitting, Cuff Size: Normal)   Pulse 97   Temp 98.3 F (36.8 C) (Oral)   Ht 5\' 7"  (1.702 m)   Wt 210 lb 9.6 oz (95.5 kg)   SpO2 97%   BMI 32.98 kg/m  BP Readings from Last 3 Encounters:  06/20/22 (!) 144/84  05/07/22 130/70  09/03/21 112/80   Wt Readings from Last 3 Encounters:  06/20/22 210 lb 9.6 oz (95.5 kg)  05/07/22 218 lb (98.9 kg)  10/28/21 229 lb (103.9 kg)      Physical Exam Vitals reviewed.  Constitutional:      General: She is not in acute distress.    Appearance: Normal appearance.  Cardiovascular:     Rate and Rhythm: Normal rate and regular rhythm.  Pulmonary:     Effort: Pulmonary effort is normal.     Breath sounds: No rales.  Musculoskeletal:     Right lower leg: No edema.     Left lower leg: No edema.  Neurological:     Mental Status: She is alert.      No results found for any visits on 06/20/22.  Last CBC  Lab Results  Component Value Date   WBC 10.9 (H) 05/07/2022   HGB 15.5 (H) 05/07/2022   HCT 46.9 (H) 05/07/2022   MCV 85.1 05/07/2022   MCH 27.8 06/17/2017   RDW 14.9 05/07/2022   PLT 369.0 05/07/2022   Last metabolic panel Lab Results  Component Value Date   GLUCOSE 95 05/07/2022   NA 139 05/07/2022   K 4.0 05/07/2022   CL 101 05/07/2022   CO2 28 05/07/2022   BUN 12 05/07/2022   CREATININE 0.70 05/07/2022   GFRNONAA >60 06/17/2017   CALCIUM 9.3 05/07/2022   PROT 7.5 05/07/2022   ALBUMIN 4.0 05/07/2022   BILITOT 0.3 05/07/2022   ALKPHOS 81 05/07/2022   AST 17 05/07/2022   ALT 15 05/07/2022   ANIONGAP 8 06/17/2017   Last hemoglobin A1c No results found for: "HGBA1C" Last thyroid  functions Lab Results  Component Value Date   TSH 3.71 05/07/2022      The 10-year ASCVD risk score (Arnett DK, et al., 2019) is: 19.6%    Assessment & Plan:   #1 recent left lower lobe pneumonia with sepsis.  Patient presented with postobstructive pneumonia with known left suprahilar lung mass.  She had no further fever.  She has chronic COPD which is currently stable.  She is on home oxygen.  We refilled her Trelegy.  Continue albuterol nebulizer as needed.  Follow-up immediately for any recurrent fever or increasing shortness of breath  #2 left suprahilar lung mass.  Patient aware that she has lung cancer.  She has declined further evaluation or treatment and is currently on hospice.  She feels very comfortable with this decision.  Currently pain is stable.  Patient is very clear that she wishes her treatment to be focused on comfort measures.  She is not opposed to antibiotics for any recurrent infection   Cynthia Peat, MD

## 2022-06-26 ENCOUNTER — Encounter: Payer: Self-pay | Admitting: Family Medicine

## 2022-06-27 ENCOUNTER — Encounter: Payer: Self-pay | Admitting: Family Medicine

## 2022-06-27 DIAGNOSIS — J449 Chronic obstructive pulmonary disease, unspecified: Secondary | ICD-10-CM

## 2022-07-09 ENCOUNTER — Encounter: Payer: Self-pay | Admitting: Family Medicine

## 2022-07-11 ENCOUNTER — Telehealth: Payer: Self-pay | Admitting: Family Medicine

## 2022-07-11 NOTE — Telephone Encounter (Signed)
Cynthia [name] - Attorney with [employer] [place] [phone]  Pt received a letter regarding her prognosis (6 months) from MD, but the letter was not signed by MD.  Gerrit Friends is asking that MD refax a signed prognosis to:  Fax:  [phone]

## 2022-07-15 VITALS — Ht 67.0 in | Wt 210.0 lb

## 2022-07-15 DIAGNOSIS — Z0000 Encounter for general adult medical examination without abnormal findings: Secondary | ICD-10-CM

## 2022-07-15 NOTE — Progress Notes (Signed)
Subjective:   Cynthia [name] is a 67 y.o. female who presents for Medicare Annual (Subsequent) preventive examination.  Review of Systems    Virtual Visit via Telephone Note  I connected with  Cynthia [name] on 07/15/22 at  3:30 PM EST by telephone and verified that I am speaking with the correct person using two identifiers.  Location: Patient: Home Provider: Office Persons participating in the virtual visit: patient/Nurse Health Advisor   I discussed the limitations, risks, security and privacy concerns of performing an evaluation and management service by telephone and the availability of in person appointments. The patient expressed understanding and agreed to proceed.  Interactive audio and video telecommunications were attempted between this nurse and patient, however failed, due to patient having technical difficulties OR patient did not have access to video capability.  We continued and completed visit with audio only.  Some vital signs may be absent or patient reported.   Criselda Peaches, LPN        Objective:    Today's Vitals   07/15/22 1542  Weight: 210 lb (95.3 kg)  Height: 5\' 7"  (1.702 m)   Body mass index is 32.89 kg/m.     04/08/2018   10:52 AM 06/17/2017   12:47 PM  Advanced Directives  Does Patient Have a Medical Advance Directive? No No  Would patient like information on creating a medical advance directive? Yes (MAU/Ambulatory/Procedural Areas - Information given) No - Patient declined    Current Medications (verified) Outpatient Encounter Medications as of 07/15/2022  Medication Sig   albuterol (VENTOLIN HFA) 108 (90 Base) MCG/ACT inhaler INHALE 2 PUFFS BY MOUTH EVERY 4 HOURS AS NEEDED FOR WHEEZE OR FOR SHORTNESS OF BREATH   ARIPiprazole (ABILIFY) 5 MG tablet Take 5 mg by mouth every morning.   famotidine (PEPCID) 20 MG tablet Take 1 tablet (20 mg total) by mouth 2 (two) times daily. *Appointment required for future refills*    Fluticasone-Umeclidin-Vilant (TRELEGY ELLIPTA) 100-62.5-25 MCG/ACT AEPB Inhale 1 puff into the lungs daily.   HYDROmorphone, PF, (DILAUDID HP) 250 MG injection Inject as directed. PT CURRENTLY WEARS A PAIN PUMP.   hydrOXYzine (VISTARIL) 50 MG capsule Take 50 mg by mouth. Take 2 tablets in the morning, one in the evening   levothyroxine (SYNTHROID) 50 MCG tablet TAKE 1 TABLET BY MOUTH EVERY DAY   losartan (COZAAR) 50 MG tablet TAKE 1 TABLET BY MOUTH EVERY DAY   naloxone (NARCAN) nasal spray 4 mg/0.1 mL Place 1 spray into the nose once.   venlafaxine XR (EFFEXOR-XR) 150 MG 24 hr capsule 150 mg. Two tablets in the morning   No facility-administered encounter medications on file as of 07/15/2022.    Allergies (verified) Codeine phosphate   History: Past Medical History:  Diagnosis Date   Bipolar disorder (HCC)    COPD (chronic obstructive pulmonary disease) (HCC)    Depression    GERD (gastroesophageal reflux disease)    Past Surgical History:  Procedure Laterality Date   APPENDECTOMY     CHOLECYSTECTOMY     LUMBAR LAMINECTOMY     6 surgeries   Family History  Problem Relation Age of Onset   Alzheimer's disease Mother    Colon cancer Father    Social History   Socioeconomic History   Marital status: Divorced    Spouse name: Not on file   Number of children: Not on file   Years of education: Not on file   Highest education level: Bachelor's degree (e.g., BA, AB,  BS)  Occupational History   Not on file  Tobacco Use   Smoking status: Every Day    Types: Cigarettes   Smokeless tobacco: Never  Vaping Use   Vaping Use: Never used  Substance and Sexual Activity   Alcohol use: No   Drug use: No   Sexual activity: Never  Other Topics Concern   Not on file  Social History Narrative   Not on file   Social Determinants of Health   Financial Resource Strain: Medium Risk (09/02/2021)   Overall Financial Resource Strain (CARDIA)    Difficulty of Paying Living Expenses:  Somewhat hard  Food Insecurity: Food Insecurity Present (09/02/2021)   Hunger Vital Sign    Worried About Running Out of Food in the Last Year: Often true    Ran Out of Food in the Last Year: Often true  Transportation Needs: Unmet Transportation Needs (09/02/2021)   PRAPARE - Transportation    Lack of Transportation (Medical): Yes    Lack of Transportation (Non-Medical): Yes  Physical Activity: Unknown (09/02/2021)   Exercise Vital Sign    Days of Exercise per Week: 0 days    Minutes of Exercise per Session: Not on file  Stress: Stress Concern Present (09/02/2021)   [place]    Feeling of Stress : To some extent  Social Connections: Moderately Isolated (09/02/2021)   Social Connection and Isolation Panel [NHANES]    Frequency of Communication with Friends and Family: More than three times a week    Frequency of Social Gatherings with Friends and Family: More than three times a week    Attends Religious Services: More than 4 times per year    Active Member of [employer] or Organizations: No    Attends Music therapist: Not on file    Marital Status: Divorced    Tobacco Counseling Ready to quit: Yes Counseling given: Yes   Clinical Intake:  Pre-visit preparation completed: No  Pain : No/denies pain     BMI - recorded: 32.98 Nutritional Status: BMI > 30  Obese Nutritional Risks: None Diabetes: No     Diabetic?  No         Activities of Daily Living     No data to display          Patient Care Team: Eulas Post, MD as PCP - General (Family Medicine)  Indicate any recent Medical Services you may have received from other than [facility] providers in the past year (date may be approximate).     Assessment:   This is a routine wellness examination for Cynthia [name].  Hearing/Vision screen No results found.  Dietary issues and exercise activities discussed:     Goals Addressed    None    Depression Screen    09/03/2021   11:07 AM 05/10/2021    2:05 PM 04/08/2018   10:42 AM  PHQ 2/9 Scores  PHQ - 2 Score 3 6 0  PHQ- 9 Score 13 9     Fall Risk    09/02/2021   12:58 PM 05/10/2021    2:05 PM 06/08/2019    5:30 PM 04/08/2018   10:42 AM  Fall Risk   Falls in the past year? 1 0 0 No  Comment   Emmi Telephone Survey: data to providers prior to load   Number falls in past yr: 1     Injury with Fall? 0       FALL RISK PREVENTION  PERTAINING TO THE HOME:  Any stairs in or around the home?  If so, are there any without handrails?  Home free of loose throw rugs in walkways, pet beds, electrical cords, etc?  Adequate lighting in your home to reduce risk of falls?   ASSISTIVE DEVICES UTILIZED TO PREVENT FALLS:  Life alert?  Use of a cane, walker or w/c?  Grab bars in the bathroom?  Shower chair or bench in shower?  Elevated toilet seat or a handicapped toilet?   TIMED UP AND GO:  Was the test performed? No . Audio Visit  Cognitive Function:        Immunizations Immunization History  Administered Date(s) Administered   Fluad Quad(high Dose 65+) 04/08/2019, 05/10/2021   Influenza Split 07/28/2011   Influenza,inj,Quad PF,6+ Mos 06/27/2013, 05/17/2014, 06/22/2018   Pneumococcal Polysaccharide-23 04/08/2019   Td 12/28/2007   Tdap 12/27/2007            Qualifies for Shingles Vaccine?  Zostavax completed    Screening Tests Health Maintenance  Topic Date Due   Medicare Annual Wellness (AWV)  Never done   COVID-19 Vaccine (1) Never done   Zoster Vaccines- Shingrix (1 of 2) Never done   DTaP/Tdap/Td (3 - Td or Tdap) 12/27/2017   DEXA SCAN  Never done   Pneumonia Vaccine 74+ Years old (2 - PCV) 04/07/2020   INFLUENZA VACCINE  Completed   Hepatitis C Screening  Completed   HPV VACCINES  Aged Out    Health Maintenance  Health Maintenance Due  Topic Date Due   Medicare Annual Wellness (AWV)  Never done   COVID-19 Vaccine (1) Never  done   Zoster Vaccines- Shingrix (1 of 2) Never done   DTaP/Tdap/Td (3 - Td or Tdap) 12/27/2017   DEXA SCAN  Never done   Pneumonia Vaccine 61+ Years old (2 - PCV) 04/07/2020          Lung Cancer Screening: (Low Dose CT Chest recommended if Age 30-80 years, 30 pack-year currently smoking OR have quit w/in 15years.)  qualify.   Lung Cancer Screening Referral:   Additional Screening:  Hepatitis C Screening:  qualify; Completed   Vision Screening: Recommended annual ophthalmology exams for early detection of glaucoma and other disorders of the eye. Is the patient up to date with their annual eye exam?   Who is the provider or what is the name of the office in which the patient attends annual eye exams?  If pt is not established with a provider, would they like to be referred to a provider to establish care? .   Dental Screening: Recommended annual dental exams for proper oral hygiene  Community Resource Referral / Chronic Care Management: CRR required this visit?    CCM required this visit?       Plan:     I have personally reviewed and noted the following in the patient's chart:   Medical and social history Use of alcohol, tobacco or illicit drugs  Current medications and supplements including opioid prescriptions.  Functional ability and status Nutritional status Physical activity Advanced directives List of other physicians Hospitalizations, surgeries, and ER visits in previous 12 months Vitals Screenings to include cognitive, depression, and falls Referrals and appointments  In addition, I have reviewed and discussed with patient certain preventive protocols, quality metrics, and best practice recommendations. A written personalized care plan for preventive services as well as general preventive health recommendations were provided to patient.     Tillie Rung, LPN  07/15/2022   Nurse Notes:      This encounter was created in error - please  disregard. Patient stated unable to complete this visit at this time. Okay to reschedule.

## 2022-07-15 NOTE — Patient Instructions (Addendum)
Cynthia [name] , Thank you for taking time to come for your Medicare Wellness Visit. I appreciate your ongoing commitment to your health goals. Please review the following plan we discussed and let me know if I can assist you in the future.   These are the goals we discussed:  Goals   None     This is a list of the screening recommended for you and due dates:  Health Maintenance  Topic Date Due   Zoster (Shingles) Vaccine (1 of 2) Never done   Screening for Lung Cancer  11/30/2007   DTaP/Tdap/Td vaccine (3 - Td or Tdap) 12/27/2017   DEXA scan (bone density measurement)  Never done   Pneumonia Vaccine (2 of 2 - PCV) 04/07/2020   COVID-19 Vaccine (4 - 2023-24 season) 03/14/2022   Flu Shot  02/12/2023   Medicare Annual Wellness Visit  07/16/2023   Hepatitis C Screening: USPSTF Recommendation to screen - Ages 18-79 yo.  Completed   HPV Vaccine  Aged Out    Advanced directives:    Conditions/risks identified: None  Next appointment: Follow up in one year for your annual wellness visit     Preventive Care 65 Years and Older, Female Preventive care refers to lifestyle choices and visits with your health care provider that can promote health and wellness. What does preventive care include? A yearly physical exam. This is also called an annual well check. Dental exams once or twice a year. Routine eye exams. Ask your health care provider how often you should have your eyes checked. Personal lifestyle choices, including: Daily care of your teeth and gums. Regular physical activity. Eating a healthy diet. Avoiding tobacco and drug use. Limiting alcohol use. Practicing safe sex. Taking low-dose aspirin every day. Taking vitamin and mineral supplements as recommended by your health care provider. What happens during an annual well check? The services and screenings done by your health care provider during your annual well check will depend on your age, overall health, lifestyle risk  factors, and family history of disease. Counseling  Your health care provider may ask you questions about your: Alcohol use. Tobacco use. Drug use. Emotional well-being. Home and relationship well-being. Sexual activity. Eating habits. History of falls. Memory and ability to understand (cognition). Work and work Astronomerenvironment. Reproductive health. Screening  You may have the following tests or measurements: Height, weight, and BMI. Blood pressure. Lipid and cholesterol levels. These may be checked every 5 years, or more frequently if you are over 69 years old. Skin check. Lung cancer screening. You may have this screening every year starting at age 69 if you have a 30-pack-year history of smoking and currently smoke or have quit within the past 15 years. Fecal occult blood test (FOBT) of the stool. You may have this test every year starting at age 69. Flexible sigmoidoscopy or colonoscopy. You may have a sigmoidoscopy every 5 years or a colonoscopy every 10 years starting at age 69. Hepatitis C blood test. Hepatitis B blood test. Sexually transmitted disease (STD) testing. Diabetes screening. This is done by checking your blood sugar (glucose) after you have not eaten for a while (fasting). You may have this done every 1-3 years. Bone density scan. This is done to screen for osteoporosis. You may have this done starting at age 165. Mammogram. This may be done every 1-2 years. Talk to your health care provider about how often you should have regular mammograms. Talk with your health care provider about your test results, treatment  options, and if necessary, the need for more tests. Vaccines  Your health care provider may recommend certain vaccines, such as: Influenza vaccine. This is recommended every year. Tetanus, diphtheria, and acellular pertussis (Tdap, Td) vaccine. You may need a Td booster every 10 years. Zoster vaccine. You may need this after age 52. Pneumococcal 13-valent  conjugate (PCV13) vaccine. One dose is recommended after age 58. Pneumococcal polysaccharide (PPSV23) vaccine. One dose is recommended after age 31. Talk to your health care provider about which screenings and vaccines you need and how often you need them. This information is not intended to replace advice given to you by your health care provider. Make sure you discuss any questions you have with your health care provider. Document Released: 07/27/2015 Document Revised: 03/19/2016 Document Reviewed: 05/01/2015 Elsevier Interactive Patient Education  2017 [place] Prevention in the Home Falls can cause injuries. They can happen to people of all ages. There are many things you can do to make your home safe and to help prevent falls. What can I do on the outside of my home? Regularly fix the edges of walkways and driveways and fix any cracks. Remove anything that might make you trip as you walk through a door, such as a raised step or threshold. Trim any bushes or trees on the path to your home. Use bright outdoor lighting. Clear any walking paths of anything that might make someone trip, such as rocks or tools. Regularly check to see if handrails are loose or broken. Make sure that both sides of any steps have handrails. Any raised decks and porches should have guardrails on the edges. Have any leaves, snow, or ice cleared regularly. Use sand or salt on walking paths during winter. Clean up any spills in your garage right away. This includes oil or grease spills. What can I do in the bathroom? Use night lights. Install grab bars by the toilet and in the tub and shower. Do not use towel bars as grab bars. Use non-skid mats or decals in the tub or shower. If you need to sit down in the shower, use a plastic, non-slip stool. Keep the floor dry. Clean up any water that spills on the floor as soon as it happens. Remove soap buildup in the tub or shower regularly. Attach bath mats  securely with double-sided non-slip rug tape. Do not have throw rugs and other things on the floor that can make you trip. What can I do in the bedroom? Use night lights. Make sure that you have a light by your bed that is easy to reach. Do not use any sheets or blankets that are too big for your bed. They should not hang down onto the floor. Have a firm chair that has side arms. You can use this for support while you get dressed. Do not have throw rugs and other things on the floor that can make you trip. What can I do in the kitchen? Clean up any spills right away. Avoid walking on wet floors. Keep items that you use a lot in easy-to-reach places. If you need to reach something above you, use a strong step stool that has a grab bar. Keep electrical cords out of the way. Do not use floor polish or wax that makes floors slippery. If you must use wax, use non-skid floor wax. Do not have throw rugs and other things on the floor that can make you trip. What can I do with my stairs? Do not  leave any items on the stairs. Make sure that there are handrails on both sides of the stairs and use them. Fix handrails that are broken or loose. Make sure that handrails are as long as the stairways. Check any carpeting to make sure that it is firmly attached to the stairs. Fix any carpet that is loose or worn. Avoid having throw rugs at the top or bottom of the stairs. If you do have throw rugs, attach them to the floor with carpet tape. Make sure that you have a light switch at the top of the stairs and the bottom of the stairs. If you do not have them, ask someone to add them for you. What else can I do to help prevent falls? Wear shoes that: Do not have high heels. Have rubber bottoms. Are comfortable and fit you well. Are closed at the toe. Do not wear sandals. If you use a stepladder: Make sure that it is fully opened. Do not climb a closed stepladder. Make sure that both sides of the stepladder  are locked into place. Ask someone to hold it for you, if possible. Clearly mark and make sure that you can see: Any grab bars or handrails. First and last steps. Where the edge of each step is. Use tools that help you move around (mobility aids) if they are needed. These include: Canes. Walkers. Scooters. Crutches. Turn on the lights when you go into a dark area. Replace any light bulbs as soon as they burn out. Set up your furniture so you have a clear path. Avoid moving your furniture around. If any of your floors are uneven, fix them. If there are any pets around you, be aware of where they are. Review your medicines with your doctor. Some medicines can make you feel dizzy. This can increase your chance of falling. Ask your doctor what other things that you can do to help prevent falls. This information is not intended to replace advice given to you by your health care provider. Make sure you discuss any questions you have with your health care provider. Document Released: 04/26/2009 Document Revised: 12/06/2015 Document Reviewed: 08/04/2014 Elsevier Interactive Patient Education  2017 [employer].

## 2022-07-15 NOTE — Telephone Encounter (Signed)
Letter faxed per request  

## 2022-08-26 ENCOUNTER — Telehealth: Payer: Self-pay | Admitting: Family Medicine

## 2022-08-26 NOTE — Telephone Encounter (Signed)
Provider requesting a call to discuss patient

## 2022-09-03 ENCOUNTER — Encounter: Payer: Self-pay | Admitting: Family Medicine

## 2022-09-24 ENCOUNTER — Other Ambulatory Visit: Payer: Self-pay | Admitting: Family Medicine

## 2022-10-23 NOTE — Progress Notes (Addendum)
This encounter was created in error - please disregard.

## 2022-10-23 NOTE — Addendum Note (Signed)
Addended by: Tillie Rung on: 10/23/2022 08:00 AM   Modules accepted: Orders, Level of Service

## 2022-10-30 ENCOUNTER — Encounter: Payer: Self-pay | Admitting: Physician Assistant

## 2022-10-31 ENCOUNTER — Telehealth: Payer: 59 | Admitting: Physician Assistant

## 2022-10-31 DIAGNOSIS — J449 Chronic obstructive pulmonary disease, unspecified: Secondary | ICD-10-CM

## 2022-10-31 DIAGNOSIS — C3490 Malignant neoplasm of unspecified part of unspecified bronchus or lung: Secondary | ICD-10-CM

## 2022-10-31 DIAGNOSIS — Z515 Encounter for palliative care: Secondary | ICD-10-CM

## 2022-10-31 NOTE — Progress Notes (Signed)
Virtual Visit Consent   Cynthia [name], you are scheduled for a virtual visit with a [place] provider today. Just as with appointments in the office, your consent must be obtained to participate. Your consent will be active for this visit and any virtual visit you may have with one of our providers in the next 365 days. If you have a MyChart account, a copy of this consent can be sent to you electronically.  As this is a virtual visit, video technology does not allow for your provider to perform a traditional examination. This may limit your provider's ability to fully assess your condition. If your provider identifies any concerns that need to be evaluated in person or the need to arrange testing (such as labs, EKG, etc.), we will make arrangements to do so. Although advances in technology are sophisticated, we cannot ensure that it will always work on either your end or our end. If the connection with a video visit is poor, the visit may have to be switched to a telephone visit. With either a video or telephone visit, we are not always able to ensure that we have a secure connection.  By engaging in this virtual visit, you consent to the provision of healthcare and authorize for your insurance to be billed (if applicable) for the services provided during this visit. Depending on your insurance coverage, you may receive a charge related to this service.  I need to obtain your verbal consent now. Are you willing to proceed with your visit today? Cynthia [name] has provided verbal consent on 10/31/2022 for a virtual visit (video or telephone). Piedad Climes, [place]  Date: 10/31/2022 5:34 PM  Virtual Visit via Video Note   I, Piedad Climes, connected with  Cynthia [name]  ([id number], 69-19-1957) on 10/31/22 at  5:30 PM EDT by a video-enabled telemedicine application and verified that I am speaking with the correct person using two identifiers.  Location: Patient: Virtual Visit  Location Patient: Home Provider: Virtual Visit Location Provider: Home Office   I discussed the limitations of evaluation and management by telemedicine and the availability of in person appointments. The patient expressed understanding and agreed to proceed.    History of Present Illness: Cynthia [name] is a 67 y.o. who identifies as a female who was assigned female at birth, and is being seen today for change in her baseline breathing over the past day with some mild voice hoarseness but also with increased work of breathing at rest despite use of her O2. Denies fever, chills, aches. Also noting increased cough but still dry. Denies heartburn or indigestion. Notes wheezing and now some pleuriti discomfort. Is currently under Hospice care as she did not want to proceed with treatment for lung malignancy. Marland Kitchen   HPI: HPI  Problems:  Patient Active Problem List   Diagnosis Date Noted   Mass of left lung 06/20/2022   Primary osteoarthritis of both hands 06/15/2019   Chronic pain syndrome 04/08/2019   Hypothyroid 09/05/2016   OSA (obstructive sleep apnea) 03/19/2016   Tobacco user 03/19/2016   Obesity (BMI 30-39.9) 06/27/2013   Urgency incontinence 12/22/2012   TOBACCO USER 12/07/2008   GERD 02/11/2008   DEPRESSION 11/21/2007   Bipolar 2 disorder 11/15/2007   COPD (chronic obstructive pulmonary disease) 11/15/2007    Allergies:  Allergies  Allergen Reactions   Codeine Phosphate Nausea And Vomiting   Medications:  Current Outpatient Medications:    albuterol (VENTOLIN HFA) 108 (90 Base) MCG/ACT  inhaler, INHALE 2 PUFFS BY MOUTH EVERY 4 HOURS AS NEEDED FOR WHEEZE OR FOR SHORTNESS OF BREATH, Disp: 8.5 each, Rfl: 1   ARIPiprazole (ABILIFY) 5 MG tablet, Take 5 mg by mouth every morning., Disp: , Rfl: 5   famotidine (PEPCID) 20 MG tablet, Take 1 tablet (20 mg total) by mouth 2 (two) times daily. *Appointment required for future refills*, Disp: 180 tablet, Rfl: 0   HYDROmorphone, PF,  (DILAUDID HP) 250 MG injection, Inject as directed. PT CURRENTLY WEARS A PAIN PUMP., Disp: , Rfl:    levothyroxine (SYNTHROID) 50 MCG tablet, TAKE 1 TABLET BY MOUTH EVERY DAY, Disp: 90 tablet, Rfl: 1   naloxone (NARCAN) nasal spray 4 mg/0.1 mL, Place 1 spray into the nose once., Disp: , Rfl:    venlafaxine XR (EFFEXOR-XR) 150 MG 24 hr capsule, 150 mg. Two tablets in the morning, Disp: , Rfl:   Observations/Objective: Patient is well-developed, well-nourished in no acute distress.  Resting comfortably at home.  Head is normocephalic, atraumatic.  No labored breathing. Wearing nasal cannula. Speech is clear and coherent with logical content.  Patient is alert and oriented at baseline.   Assessment and Plan: 1. Chronic obstructive pulmonary disease, unspecified COPD type  2. Malignant neoplasm of lung, unspecified laterality, unspecified part of lung  3. Palliative care patient  She is to contact Hospice provider for further evaluation and management giving change in symptoms as she does not tolerate oral steroids, she is on high-dose pain medication and cannot tolerate inhalers prescribed by her pulmonologist.She is going to reach out to them directly. If unable to get through would recommend ER evaluation.   Follow Up Instructions: I discussed the assessment and treatment plan with the patient. The patient was provided an opportunity to ask questions and all were answered. The patient agreed with the plan and demonstrated an understanding of the instructions.  A copy of instructions were sent to the patient via MyChart unless otherwise noted below.   The patient was advised to call back or seek an in-person evaluation if the symptoms worsen or if the condition fails to improve as anticipated.  Time:  I spent 10 minutes with the patient via telehealth technology discussing the above problems/concerns.    Piedad Climes, PA-C

## 2022-11-10 ENCOUNTER — Telehealth: Payer: Self-pay | Admitting: Family Medicine

## 2022-11-10 NOTE — Telephone Encounter (Signed)
Contacted Rober Minion to schedule their annual wellness visit. Appointment made for 11/13/22.  Rudell Cobb AWV direct phone # [phone]

## 2022-11-13 ENCOUNTER — Encounter (INDEPENDENT_AMBULATORY_CARE_PROVIDER_SITE_OTHER): Payer: 59 | Admitting: Family Medicine

## 2022-11-13 NOTE — Progress Notes (Signed)
Patient canceled per nursing staff.

## 2022-11-28 ENCOUNTER — Telehealth: Payer: 59 | Admitting: Family Medicine

## 2022-11-28 DIAGNOSIS — R112 Nausea with vomiting, unspecified: Secondary | ICD-10-CM | POA: Diagnosis not present

## 2022-11-28 MED ORDER — ONDANSETRON HCL 4 MG PO TABS: 4.0000 mg | ORAL_TABLET | Freq: Three times a day (TID) | ORAL | 0 refills | Status: DC | PRN

## 2022-11-28 NOTE — Progress Notes (Signed)
E-Visit for Nausea and Vomiting   We are sorry that you are not feeling well. Here is how we plan to help!  Based on what you have shared with me it looks like you have a Virus that is irritating your GI tract.  Vomiting is the forceful emptying of a portion of the stomach's content through the mouth.  Although nausea and vomiting can make you feel miserable, it's important to remember that these are not diseases, but rather symptoms of an underlying illness.  When we treat short term symptoms, we always caution that any symptoms that persist should be fully evaluated in a medical office.  I have prescribed a medication that will help alleviate your symptoms and allow you to stay hydrated:  Zofran 4 mg 1 tablet every 8 hours as needed for nausea and vomiting  The morphine could be worsening the nausea. I will send zofran to treat nausea but you need to contact prescriber of morphine for further instructions.   HOME CARE: Drink clear liquids.  This is very important! Dehydration (the lack of fluid) can lead to a serious complication.  Start off with 1 tablespoon every 5 minutes for 8 hours. You may begin eating bland foods after 8 hours without vomiting.  Start with saltine crackers, white bread, rice, mashed potatoes, applesauce. After 48 hours on a bland diet, you may resume a normal diet. Try to go to sleep.  Sleep often empties the stomach and relieves the need to vomit.  GET HELP RIGHT AWAY IF:  Your symptoms do not improve or worsen within 2 days after treatment. You have a fever for over 3 days. You cannot keep down fluids after trying the medication.  MAKE SURE YOU:  Understand these instructions. Will watch your condition. Will get help right away if you are not doing well or get worse.    Thank you for choosing an e-visit.  Your e-visit answers were reviewed by a board certified advanced clinical practitioner to complete your personal care plan. Depending upon the condition,  your plan could have included both over the counter or prescription medications.  Please review your pharmacy choice. Make sure the pharmacy is open so you can pick up prescription now. If there is a problem, you may contact your provider through [employer] and have the prescription routed to another pharmacy.  Your safety is important to [place]. If you have drug allergies check your prescription carefully.   For the next 24 hours you can use MyChart to ask questions about today's visit, request a non-urgent call back, or ask for a work or school excuse. You will get an email in the next two days asking about your experience. I hope that your e-visit has been valuable and will speed your recovery.    have provided 5 minutes of non face to face time during this encounter for chart review and documentation.

## 2022-12-13 ENCOUNTER — Telehealth: Payer: 59 | Admitting: Physician Assistant

## 2022-12-13 DIAGNOSIS — M25561 Pain in right knee: Secondary | ICD-10-CM

## 2022-12-13 NOTE — Progress Notes (Signed)
Virtual Visit Consent   Cynthia [name], you are scheduled for a virtual visit with a [employer] provider today. Just as with appointments in the office, your consent must be obtained to participate. Your consent will be active for this visit and any virtual visit you may have with one of our providers in the next 365 days. If you have a MyChart account, a copy of this consent can be sent to you electronically.  As this is a virtual visit, video technology does not allow for your provider to perform a traditional examination. This may limit your provider's ability to fully assess your condition. If your provider identifies any concerns that need to be evaluated in person or the need to arrange testing (such as labs, EKG, etc.), we will make arrangements to do so. Although advances in technology are sophisticated, we cannot ensure that it will always work on either your end or our end. If the connection with a video visit is poor, the visit may have to be switched to a telephone visit. With either a video or telephone visit, we are not always able to ensure that we have a secure connection.  By engaging in this virtual visit, you consent to the provision of healthcare and authorize for your insurance to be billed (if applicable) for the services provided during this visit. Depending on your insurance coverage, you may receive a charge related to this service.  I need to obtain your verbal consent now. Are you willing to proceed with your visit today? Cynthia [name] has provided verbal consent on 12/13/2022 for a virtual visit (video or telephone). Tylene Fantasia Ward, PA-C  Date: 12/13/2022 9:19 AM  Virtual Visit via Video Note   I, Tylene Fantasia Ward, connected with  Cynthia [name]  ([id number], [birth year]) on 12/13/22 at  9:15 AM EDT by a video-enabled telemedicine application and verified that I am speaking with the correct person using two identifiers.  Location: Patient: Virtual Visit Location  Patient: Home Provider: Virtual Visit Location Provider: Home   I discussed the limitations of evaluation and management by telemedicine and the availability of in person appointments. The patient expressed understanding and agreed to proceed.    History of Present Illness: Cynthia [name] is a 67 y.o. who identifies as a female who was assigned female at birth, and is being seen today for right knee pain that started yesterday. Pain is worse with weight bearing.  No injury or trauma.  She follow with a pain specialist and receives injections in the left knee.  Feels like she has been favoring the right leg more due to left knee problems.  Denies redness or swelling to the knee.   HPI: HPI  Problems:  Patient Active Problem List   Diagnosis Date Noted   Mass of left lung 06/20/2022   Primary osteoarthritis of both hands 06/15/2019   Chronic pain syndrome 04/08/2019   Hypothyroid 09/05/2016   OSA (obstructive sleep apnea) 03/19/2016   Tobacco user 03/19/2016   Obesity (BMI 30-39.9) 06/27/2013   Urgency incontinence 12/22/2012   TOBACCO USER 12/07/2008   GERD 02/11/2008   DEPRESSION 11/21/2007   Bipolar 2 disorder (HCC) 11/15/2007   COPD (chronic obstructive pulmonary disease) (HCC) 11/15/2007    Allergies:  Allergies  Allergen Reactions   Codeine Phosphate Nausea And Vomiting   Medications:  Current Outpatient Medications:    albuterol (VENTOLIN HFA) 108 (90 Base) MCG/ACT inhaler, INHALE 2 PUFFS BY MOUTH EVERY 4 HOURS AS NEEDED FOR  WHEEZE OR FOR SHORTNESS OF BREATH, Disp: 8.5 each, Rfl: 1   ARIPiprazole (ABILIFY) 5 MG tablet, Take 5 mg by mouth every morning., Disp: , Rfl: 5   famotidine (PEPCID) 20 MG tablet, Take 1 tablet (20 mg total) by mouth 2 (two) times daily. *Appointment required for future refills*, Disp: 180 tablet, Rfl: 0   HYDROmorphone, PF, (DILAUDID HP) 250 MG injection, Inject as directed. PT CURRENTLY WEARS A PAIN PUMP., Disp: , Rfl:    levothyroxine  (SYNTHROID) 50 MCG tablet, TAKE 1 TABLET BY MOUTH EVERY DAY, Disp: 90 tablet, Rfl: 1   naloxone (NARCAN) nasal spray 4 mg/0.1 mL, Place 1 spray into the nose once., Disp: , Rfl:    ondansetron (ZOFRAN) 4 MG tablet, Take 1 tablet (4 mg total) by mouth every 8 (eight) hours as needed for nausea or vomiting., Disp: 20 tablet, Rfl: 0   venlafaxine XR (EFFEXOR-XR) 150 MG 24 hr capsule, 150 mg. Two tablets in the morning, Disp: , Rfl:   Observations/Objective: Patient is well-developed, well-nourished in no acute distress.  Resting comfortably at home.  Head is normocephalic, atraumatic.  No labored breathing.  Speech is clear and coherent with logical content.  Patient is alert and oriented at baseline.    Assessment and Plan: 1. Acute pain of right knee  Supportive care discussed.  If no improvement advised in person evaluation.   Follow Up Instructions: I discussed the assessment and treatment plan with the patient. The patient was provided an opportunity to ask questions and all were answered. The patient agreed with the plan and demonstrated an understanding of the instructions.  A copy of instructions were sent to the patient via MyChart unless otherwise noted below.     The patient was advised to call back or seek an in-person evaluation if the symptoms worsen or if the condition fails to improve as anticipated.  Time:  I spent 6 minutes with the patient via telehealth technology discussing the above problems/concerns.    Tylene Fantasia Ward, PA-C

## 2022-12-13 NOTE — Patient Instructions (Signed)
  Cynthia [name], thank you for joining Tylene Fantasia Ward, PA-C for today's virtual visit.  While this provider is not your primary care provider (PCP), if your PCP is located in our provider database this encounter information will be shared with them immediately following your visit.   A [facility] MyChart account gives you access to today's visit and all your visits, tests, and labs performed at [facility] " click here if you don't have a [facility] MyChart account or go to [email or web address]  Consent: (Patient) Cynthia [name] provided verbal consent for this virtual visit at the beginning of the encounter.  Current Medications:  Current Outpatient Medications:    albuterol (VENTOLIN HFA) 108 (90 Base) MCG/ACT inhaler, INHALE 2 PUFFS BY MOUTH EVERY 4 HOURS AS NEEDED FOR WHEEZE OR FOR SHORTNESS OF BREATH, Disp: 8.5 each, Rfl: 1   ARIPiprazole (ABILIFY) 5 MG tablet, Take 5 mg by mouth every morning., Disp: , Rfl: 5   famotidine (PEPCID) 20 MG tablet, Take 1 tablet (20 mg total) by mouth 2 (two) times daily. *Appointment required for future refills*, Disp: 180 tablet, Rfl: 0   HYDROmorphone, PF, (DILAUDID HP) 250 MG injection, Inject as directed. PT CURRENTLY WEARS A PAIN PUMP., Disp: , Rfl:    levothyroxine (SYNTHROID) 50 MCG tablet, TAKE 1 TABLET BY MOUTH EVERY DAY, Disp: 90 tablet, Rfl: 1   naloxone (NARCAN) nasal spray 4 mg/0.1 mL, Place 1 spray into the nose once., Disp: , Rfl:    ondansetron (ZOFRAN) 4 MG tablet, Take 1 tablet (4 mg total) by mouth every 8 (eight) hours as needed for nausea or vomiting., Disp: 20 tablet, Rfl: 0   venlafaxine XR (EFFEXOR-XR) 150 MG 24 hr capsule, 150 mg. Two tablets in the morning, Disp: , Rfl:    Medications ordered in this encounter:  No orders of the defined types were placed in this encounter.    *If you need refills on other medications prior to your next appointment, please contact your pharmacy*  Follow-Up: Call back  or seek an in-person evaluation if the symptoms worsen or if the condition fails to improve as anticipated.  [facility] [phone]  Other Instructions Recommend rest, ice, elevation.  If no improvement follow up with PCP or orthopedics for in person evaluation.    If you have been instructed to have an in-person evaluation today at a local Urgent Care facility, please use the link below. It will take you to a list of all of our available [facility], including address, phone number and hours of operation. Please do not delay care.  [facility]  If you or a family member do not have a primary care provider, use the link below to schedule a visit and establish care. When you choose a [facility] primary care physician or advanced practice provider, you gain a long-term partner in health. Find a Primary Care Provider  Learn more about [facility]'s in-office and virtual care options: [facility] - Get Care Now

## 2022-12-21 ENCOUNTER — Encounter: Payer: Self-pay | Admitting: Family Medicine

## 2022-12-22 ENCOUNTER — Other Ambulatory Visit (HOSPITAL_COMMUNITY): Payer: Self-pay

## 2022-12-22 MED ORDER — ONDANSETRON HCL 8 MG PO TABS: ORAL_TABLET | ORAL | 1 refills | Status: AC

## 2022-12-22 MED ORDER — ONDANSETRON HCL 8 MG PO TABS: ORAL_TABLET | ORAL | 1 refills | Status: DC

## 2022-12-22 NOTE — Addendum Note (Signed)
Addended by: Christy Sartorius on: 12/22/2022 02:57 PM   Modules accepted: Orders

## 2023-01-01 ENCOUNTER — Encounter (INDEPENDENT_AMBULATORY_CARE_PROVIDER_SITE_OTHER): Payer: 59 | Admitting: Family Medicine

## 2023-01-01 NOTE — Progress Notes (Signed)
error 

## 2023-01-08 ENCOUNTER — Encounter (INDEPENDENT_AMBULATORY_CARE_PROVIDER_SITE_OTHER): Payer: 59 | Admitting: Family Medicine

## 2023-01-08 NOTE — Progress Notes (Signed)
  Could not reach patient for appt. Kept getting message "call can not be completed as dialed."

## 2023-02-20 ENCOUNTER — Encounter: Payer: Self-pay | Admitting: Family Medicine

## 2023-02-20 ENCOUNTER — Ambulatory Visit (INDEPENDENT_AMBULATORY_CARE_PROVIDER_SITE_OTHER): Admitting: Family Medicine

## 2023-02-20 VITALS — BP 124/70 | HR 102 | Temp 99.0°F | Wt 231.0 lb

## 2023-02-20 DIAGNOSIS — R110 Nausea: Secondary | ICD-10-CM

## 2023-02-20 DIAGNOSIS — R918 Other nonspecific abnormal finding of lung field: Secondary | ICD-10-CM | POA: Diagnosis not present

## 2023-02-20 LAB — BASIC METABOLIC PANEL
BUN: 9 mg/dL (ref 6–23)
CO2: 30 mEq/L (ref 19–32)
Calcium: 9.7 mg/dL (ref 8.4–10.5)
Chloride: 98 mEq/L (ref 96–112)
Creatinine, Ser: 0.68 mg/dL (ref 0.40–1.20)
GFR: 89.13 mL/min (ref >60.00)
Glucose, Bld: 88 mg/dL (ref 70–99)
Potassium: 4.5 mEq/L (ref 3.5–5.1)
Sodium: 136 mEq/L (ref 135–145)

## 2023-02-20 MED ORDER — PROMETHAZINE HCL 25 MG PO TABS: 25.0000 mg | ORAL_TABLET | Freq: Three times a day (TID) | ORAL | 0 refills | Status: DC | PRN

## 2023-02-20 NOTE — Progress Notes (Signed)
Established Patient Office Visit  Subjective   Patient ID: Cynthia [name], female    DOB: 22-Jun-1956  Age: 67 y.o. MRN: [id number]  No chief complaint on file.   HPI   Cynthia [name]' was seen today with basically 27-month history of nausea.  She is followed by [facility] chest specialists and has lung cancer.  She was admitted last February with complicated pneumonia and angiogram revealed left hilar mass with spiculated lesion left upper lobe.  Patient has refused any sort of lung cancer treatment.  She is currently under hospice care.  She has daily nausea but no vomiting.  Occasional headaches.  No fever.  No dysuria.  No abdominal pain.  No diarrhea.  Is still able to eat but poor appetite.  Keeping down fluids.  She has gotten Zofran through hospice but even with 8 mg dose is having very little effect on her nausea.  She has taken Phenergan in the past with some improvement.  Longstanding history of smoking.  She is on chronic oxygen.  Her weight is actually up slightly.  She attributes this to poor diet.  She is eating lots of snack foods such as "little Debbie's ".  Past Medical History:  Diagnosis Date   Bipolar disorder (HCC)    COPD (chronic obstructive pulmonary disease) (HCC)    Depression    GERD (gastroesophageal reflux disease)    Past Surgical History:  Procedure Laterality Date   APPENDECTOMY     CHOLECYSTECTOMY     LUMBAR LAMINECTOMY     6 surgeries    reports that she has been smoking cigarettes. She has never used smokeless tobacco. She reports that she does not drink alcohol and does not use drugs. family history includes Alzheimer's disease in her mother; Colon cancer in her father. Allergies  Allergen Reactions   Codeine Phosphate Nausea And Vomiting    Review of Systems  Constitutional:  Negative for chills and fever.  Cardiovascular:  Negative for chest pain.  Gastrointestinal:  Positive for nausea. Negative for abdominal pain, blood in stool, constipation,  diarrhea, melena and vomiting.  Genitourinary:  Negative for dysuria.      Objective:     BP 124/70 (BP Location: Left Arm, Patient Position: Sitting, Cuff Size: Large)   Pulse (!) 102   Temp 99 F (37.2 C) (Oral)   Wt 231 lb (104.8 kg)   SpO2 93%   BMI 36.18 kg/m  BP Readings from Last 3 Encounters:  02/20/23 124/70  06/20/22 (!) 144/84  05/07/22 130/70   Wt Readings from Last 3 Encounters:  02/20/23 231 lb (104.8 kg)  07/15/22 210 lb (95.3 kg)  06/20/22 210 lb 9.6 oz (95.5 kg)      Physical Exam Vitals reviewed.  Constitutional:      General: She is not in acute distress.    Appearance: Normal appearance.  Cardiovascular:     Rate and Rhythm: Normal rate and regular rhythm.  Pulmonary:     Comments: Somewhat diminished breath sounds in both bases.  No rales. Neurological:     Mental Status: She is alert.      No results found for any visits on 02/20/23.  Last CBC Lab Results  Component Value Date   WBC 10.9 (H) 05/07/2022   HGB 15.5 (H) 05/07/2022   HCT 46.9 (H) 05/07/2022   MCV 85.1 05/07/2022   MCH 27.8 06/17/2017   RDW 14.9 05/07/2022   PLT 369.0 05/07/2022   Last metabolic panel Lab Results  Component  Value Date   GLUCOSE 95 05/07/2022   NA 139 05/07/2022   K 4.0 05/07/2022   CL 101 05/07/2022   CO2 28 05/07/2022   BUN 12 05/07/2022   CREATININE 0.70 05/07/2022   GFR 89.00 05/07/2022   CALCIUM 9.3 05/07/2022   PROT 7.5 05/07/2022   ALBUMIN 4.0 05/07/2022   BILITOT 0.3 05/07/2022   ALKPHOS 81 05/07/2022   AST 17 05/07/2022   ALT 15 05/07/2022   ANIONGAP 8 06/17/2017      The 10-year ASCVD risk score (Arnett DK, et al., 2019) is: 11.3%    Assessment & Plan:   67 year old female with lung cancer currently under hospice care.  She is presenting with 3-month history of reported nausea.  She has under chronic pain management for chronic back pain but does not feel like this is triggering her nausea likely.  No abdominal pain.  No  vomiting.  -Check electrolytes to rule out any severe electrolyte disturbance -Discussed trial of Phenergan 25 mg every 8 hours as needed.  She has not responded to Zofran 8 mg.  She will give some feedback if not seeing any improvement with Phenergan.  She is aware of potential for sedation with this  No follow-ups on file.    Evelena Peat, MD

## 2023-02-23 ENCOUNTER — Encounter: Payer: Self-pay | Admitting: Family Medicine

## 2023-02-24 MED ORDER — PROCHLORPERAZINE MALEATE 5 MG PO TABS: ORAL_TABLET | ORAL | 0 refills | Status: DC

## 2023-03-02 ENCOUNTER — Other Ambulatory Visit: Payer: Self-pay | Admitting: Family Medicine

## 2023-03-02 MED ORDER — PROCHLORPERAZINE MALEATE 5 MG PO TABS: ORAL_TABLET | ORAL | 0 refills | Status: DC

## 2023-03-02 NOTE — Addendum Note (Signed)
Addended by: Christy Sartorius on: 03/02/2023 01:25 PM   Modules accepted: Orders

## 2023-03-17 ENCOUNTER — Other Ambulatory Visit: Payer: Self-pay | Admitting: Family Medicine

## 2023-03-21 ENCOUNTER — Encounter: Payer: Self-pay | Admitting: Family Medicine

## 2023-04-30 ENCOUNTER — Encounter: Payer: Self-pay | Admitting: Family Medicine

## 2023-05-15 ENCOUNTER — Encounter: Payer: Self-pay | Admitting: Family Medicine

## 2023-05-18 MED ORDER — SCOPOLAMINE 1 MG/3DAYS TD PT72: MEDICATED_PATCH | TRANSDERMAL | 1 refills | Status: DC

## 2023-05-21 ENCOUNTER — Encounter: Payer: Self-pay | Admitting: Family Medicine

## 2023-05-21 MED ORDER — SCOPOLAMINE 1 MG/3DAYS TD PT72: MEDICATED_PATCH | TRANSDERMAL | 1 refills | Status: AC

## 2023-06-16 ENCOUNTER — Ambulatory Visit: Payer: 59 | Admitting: Family Medicine

## 2023-06-22 ENCOUNTER — Ambulatory Visit (INDEPENDENT_AMBULATORY_CARE_PROVIDER_SITE_OTHER): Payer: 59 | Admitting: Family Medicine

## 2023-06-22 ENCOUNTER — Encounter: Payer: Self-pay | Admitting: Family Medicine

## 2023-06-22 VITALS — BP 126/70 | HR 100 | Temp 99.2°F | Ht 67.0 in | Wt 222.6 lb

## 2023-06-22 DIAGNOSIS — J449 Chronic obstructive pulmonary disease, unspecified: Secondary | ICD-10-CM | POA: Diagnosis not present

## 2023-06-22 DIAGNOSIS — Z23 Encounter for immunization: Secondary | ICD-10-CM | POA: Diagnosis not present

## 2023-06-22 DIAGNOSIS — C3490 Malignant neoplasm of unspecified part of unspecified bronchus or lung: Secondary | ICD-10-CM | POA: Diagnosis not present

## 2023-06-22 NOTE — Progress Notes (Signed)
Established Patient Office Visit  Subjective   Patient ID: Cynthia [name], female    DOB: 05-13-1956  Age: 67 y.o. MRN: [id number]  Chief Complaint  Patient presents with   Cancer    HPI   Cynthia [name] is seen for follow-up to bring [place] up to speed regarding her cancer diagnosis.  She was diagnosed with lung cancer over a year ago.  She elected not to have any further intervention in terms of biopsy or any treatment such as radiation or chemotherapy.  She is currently under hospice care and they come out at least once per week and she also has caregivers that help with bathing at least once a week.  She is overall fairly stable.  She has home oxygen which she uses more or less continuously.  She has underlying COPD history.  Currently has no major pain issues.  She has had some chronic nausea which did not respond very well to Zofran or Phenergan.  She eventually responded with Compazine and scopolamine patch.  Does have some recent cough but no fever.  Chronic dyspnea unchanged.  No hemoptysis.  Appetite fair.  Was recently seen at [facility] back in November.  She had CT angiogram of the lungs which showed no pulmonary embolus.  Interval increase in large left upper lobe soft tissue mass occluding the left mainstream bronchus and narrowing the left main pulmonary artery.  Mention also of left pleural effusion which was small.  New 6 mm solid pulmonary nodule right lower lobe.  Persistent mediastinal adenopathy.  New ill-defined lesion pancreatic tail.  Interval increase in size of hypoattenuating lesion mid pole left kidney suspicious for malignancy.  Also mention of small pericardial effusion.  Patient is aware of the gravity of her diagnosis.  She is very comfortable with continue hospice care and no further intervention  Past Medical History:  Diagnosis Date   Bipolar disorder (HCC)    COPD (chronic obstructive pulmonary disease) (HCC)    Depression    GERD (gastroesophageal reflux  disease)    Past Surgical History:  Procedure Laterality Date   APPENDECTOMY     CHOLECYSTECTOMY     LUMBAR LAMINECTOMY     6 surgeries    reports that she has been smoking cigarettes. She has never used smokeless tobacco. She reports that she does not drink alcohol and does not use drugs. family history includes Alzheimer's disease in her mother; Colon cancer in her father. Allergies  Allergen Reactions   Codeine Phosphate Nausea And Vomiting    Review of Systems  Constitutional:  Negative for chills, fever and weight loss.  Respiratory:  Positive for cough. Negative for hemoptysis, sputum production and shortness of breath.   Cardiovascular:  Negative for chest pain.  Gastrointestinal:  Positive for nausea. Negative for melena.  Genitourinary:  Negative for dysuria.  Neurological:  Negative for headaches.  Psychiatric/Behavioral:  Negative for depression.       Objective:     BP 126/70 (BP Location: Left Arm, Patient Position: Sitting, Cuff Size: Normal)   Pulse 100   Temp 99.2 F (37.3 C) (Oral)   Ht 5\' 7"  (1.702 m)   Wt 222 lb 9.6 oz (101 kg)   SpO2 96%   BMI 34.86 kg/m  BP Readings from Last 3 Encounters:  06/22/23 126/70  02/20/23 124/70  06/20/22 (!) 144/84   Wt Readings from Last 3 Encounters:  06/22/23 222 lb 9.6 oz (101 kg)  02/20/23 231 lb (104.8 kg)  07/15/22  210 lb (95.3 kg)      Physical Exam Vitals reviewed.  Constitutional:      General: She is not in acute distress.    Appearance: She is not ill-appearing.  Cardiovascular:     Rate and Rhythm: Normal rate and regular rhythm.  Pulmonary:     Comments: Somewhat diminished breath sounds both bases.  Few scattered expiratory wheezes.  O2 sats 96% 2 L Neurological:     Mental Status: She is alert.      No results found for any visits on 06/22/23.    The 10-year ASCVD risk score (Arnett DK, et al., 2019) is: 12.7%    Assessment & Plan:   Problem List Items Addressed This Visit    None Visit Diagnoses     Need for influenza vaccination    -  Primary   Relevant Orders   Flu Vaccine Trivalent High Dose (Fluad) (Completed)     Metastatic lung cancer.  Patient understands the gravity of her diagnosis.  She understands that she has limited life expectancy.  She has been very clear from the onset that she did not want any further intervention or treatment measures such as radiation or chemotherapy.  No tissue diagnosis was done.  She is currently under hospice care with main goal of comfort care.  Her nausea is currently fairly well-controlled.  She did like to go ahead with flu vaccine today.  We explained we would be happy to help in any way possible although she [place]  No follow-ups on file.    Evelena Peat, MD

## 2023-08-06 ENCOUNTER — Ambulatory Visit: Payer: Self-pay | Admitting: Family Medicine

## 2023-08-06 NOTE — Telephone Encounter (Signed)
Chief Complaint: SOB Symptoms: can't catch breath Frequency: constant Disposition: [x] ED /[] Urgent Care (no appt availability in office) / [] Appointment(In office/virtual)/ []  [facility]/ [] Home Care/ [] Refused Recommended Disposition /[] [facility]/ []  Follow-up with PCP Additional Notes: Pt was admitted to hospital on 1/17 with Pulmonary Embolism. Pt has history of lung cancer, PE, and COPD. Pt was calling to set up follow-up appt. Pt sounded very short of breath upon assessing. Pt stated she was discharged home with no instructions and medication to treat PE. Pt's oxygen saturation level was 87% on 5L of oxygen. RN offered to call 911 for pt, but pt stated she would call them after we got off phone. RN gave care advice and emphasized importance of calling us back to set up follow up appt with provider. Pt verbalized understanding.            Copied from CRM [id number]. Topic: Clinical - Medication Refill >> Aug 06, 2023  3:37 PM Drema Balzarine wrote: Most Recent Primary Care Visit:  Provider: Kristian Covey  Department: [facility]  Visit Type: OFFICE VISIT  Date: 06/22/2023  Medication: Blood thinner medication. Patient was discharged from hospital on 08/04/23 and wasn't prescribed meds for her blood clot. Refused to schedule hospital follow up.  Has the patient contacted their pharmacy? Yes (Agent: If no, request that the patient contact the pharmacy for the refill. If patient does not wish to contact the pharmacy document the reason why and proceed with request.) (Agent: If yes, when and what did the pharmacy advise?)  Is this the correct pharmacy for this prescription? Yes If no, delete pharmacy and type the correct one.  This is the patient's preferred pharmacy:     [facility] - [place], [place] - [address] [place] [address] Phone: [phone] Fax: [phone]    Has the  prescription been filled recently? No  Is the patient out of the medication? Yes  Has the patient been seen for an appointment in the last year OR does the patient have an upcoming appointment? Yes  Can we respond through MyChart? No  Agent: Please be advised that Rx refills may take up to 3 business days. We ask that you follow-up with your pharmacy. Reason for Disposition  SEVERE difficulty breathing (e.g., struggling for each breath, speaks in single words)  Answer Assessment - Initial Assessment Questions 1. RESPIRATORY STATUS: "Describe your breathing?" (e.g., wheezing, shortness of breath, unable to speak, severe coughing)      Coughing, any movement  2. ONSET: "When did this breathing problem begin?"      4 days ago 3. PATTERN "Does the difficult breathing come and go, or has it been constant since it started?"      Constant  4. SEVERITY: "How bad is your breathing?" (e.g., mild, moderate, severe)    - MILD: No SOB at rest, mild SOB with walking, speaks normally in sentences, can lie down, no retractions, pulse < 100.    - MODERATE: SOB at rest, SOB with minimal exertion and prefers to sit, cannot lie down flat, speaks in phrases, mild retractions, audible wheezing, pulse 100-120.    - SEVERE: Very SOB at rest, speaks in single words, struggling to breathe, sitting hunched forward, retractions, pulse > 120      severe 5. RECURRENT SYMPTOM: "Have you had difficulty breathing before?" If Yes, ask: "When was the last time?" and "What happened that time?"  Yes, 5 or 6 months  6. CARDIAC HISTORY: "Do you have any history of heart disease?" (e.g., heart attack, angina, bypass surgery, angioplasty)      denies 7. LUNG HISTORY: "Do you have any history of lung disease?"  (e.g., pulmonary embolus, asthma, emphysema)     Pulmonary embolus, COPD, lung cancer 8. CAUSE: "What do you think is causing the breathing problem?"      Blood clot 9. OTHER SYMPTOMS: "Do you have any other  symptoms? (e.g., dizziness, runny nose, cough, chest pain, fever)     dizziness 10. O2 SATURATION MONITOR:  "Do you use an oxygen saturation monitor (pulse oximeter) at home?" If Yes, ask: "What is your reading (oxygen level) today?" "What is your usual oxygen saturation reading?" (e.g., 95%)       87% on 5L  Protocols used: Breathing Difficulty-A-AH

## 2023-08-06 NOTE — Telephone Encounter (Signed)
I spoke with the patient and advised she been seen in ED asap. I offered to call 911 for patient but she declined at this time. Patient stated she has located her concentrator for her Oxygen and will use that. I advised the importance of patient being seen asap but patient stated that she would be fine and would call 911 if her symptoms worsen.
